# Patient Record
Sex: Female | Born: 1955
Health system: Southern US, Community
[De-identification: ages and names within clinical notes are randomized; demographics above are authoritative.]

## PROBLEM LIST (undated history)

## (undated) DIAGNOSIS — E785 Hyperlipidemia, unspecified: Secondary | ICD-10-CM

## (undated) DIAGNOSIS — K635 Polyp of colon: Secondary | ICD-10-CM

## (undated) DIAGNOSIS — Z86010 Personal history of colonic polyps: Secondary | ICD-10-CM

## (undated) DIAGNOSIS — M1990 Unspecified osteoarthritis, unspecified site: Secondary | ICD-10-CM

## (undated) DIAGNOSIS — T7840XA Allergy, unspecified, initial encounter: Secondary | ICD-10-CM

## (undated) DIAGNOSIS — E119 Type 2 diabetes mellitus without complications: Secondary | ICD-10-CM

## (undated) DIAGNOSIS — I10 Essential (primary) hypertension: Secondary | ICD-10-CM

## (undated) HISTORY — DX: Polyp of colon: K63.5

## (undated) HISTORY — DX: Essential (primary) hypertension: I10

## (undated) HISTORY — DX: Personal history of colonic polyps: Z86.010

## (undated) HISTORY — DX: Allergy, unspecified, initial encounter: T78.40XA

## (undated) HISTORY — DX: Unspecified osteoarthritis, unspecified site: M19.90

## (undated) HISTORY — DX: Type 2 diabetes mellitus without complications: E11.9

## (undated) HISTORY — DX: Hyperlipidemia, unspecified: E78.5

## (undated) HISTORY — PX: REPLACEMENT TOTAL KNEE: SUR1224

---

## 1981-03-16 HISTORY — PX: TUBAL LIGATION: SHX77

## 1996-10-14 HISTORY — PX: ABDOMINAL HYSTERECTOMY: SHX81

## 1999-03-04 ENCOUNTER — Other Ambulatory Visit: Admission: RE | Admit: 1999-03-04 | Discharge: 1999-03-04 | Payer: Self-pay | Admitting: Obstetrics & Gynecology

## 2000-08-30 ENCOUNTER — Other Ambulatory Visit: Admission: RE | Admit: 2000-08-30 | Discharge: 2000-08-30 | Payer: Self-pay | Admitting: Obstetrics & Gynecology

## 2002-03-15 ENCOUNTER — Other Ambulatory Visit: Admission: RE | Admit: 2002-03-15 | Discharge: 2002-03-15 | Payer: Self-pay | Admitting: Obstetrics & Gynecology

## 2002-03-22 ENCOUNTER — Encounter: Admission: RE | Admit: 2002-03-22 | Discharge: 2002-03-22 | Payer: Self-pay | Admitting: Obstetrics & Gynecology

## 2002-03-22 ENCOUNTER — Encounter: Payer: Self-pay | Admitting: Obstetrics & Gynecology

## 2003-04-02 ENCOUNTER — Encounter: Admission: RE | Admit: 2003-04-02 | Discharge: 2003-07-01 | Payer: Self-pay | Admitting: Internal Medicine

## 2003-06-26 ENCOUNTER — Other Ambulatory Visit: Admission: RE | Admit: 2003-06-26 | Discharge: 2003-06-26 | Payer: Self-pay | Admitting: Obstetrics and Gynecology

## 2004-03-31 ENCOUNTER — Ambulatory Visit (HOSPITAL_COMMUNITY): Admission: RE | Admit: 2004-03-31 | Discharge: 2004-03-31 | Payer: Self-pay | Admitting: Obstetrics & Gynecology

## 2005-04-07 ENCOUNTER — Ambulatory Visit (HOSPITAL_COMMUNITY): Admission: RE | Admit: 2005-04-07 | Discharge: 2005-04-07 | Payer: Self-pay | Admitting: Obstetrics & Gynecology

## 2005-04-07 ENCOUNTER — Other Ambulatory Visit: Admission: RE | Admit: 2005-04-07 | Discharge: 2005-04-07 | Payer: Self-pay | Admitting: Obstetrics & Gynecology

## 2005-10-16 DIAGNOSIS — K635 Polyp of colon: Secondary | ICD-10-CM

## 2005-10-16 HISTORY — DX: Polyp of colon: K63.5

## 2005-10-16 HISTORY — PX: COLONOSCOPY W/ BIOPSIES: SHX1374

## 2006-09-07 ENCOUNTER — Ambulatory Visit (HOSPITAL_COMMUNITY): Admission: RE | Admit: 2006-09-07 | Discharge: 2006-09-07 | Payer: Self-pay | Admitting: Obstetrics & Gynecology

## 2007-09-08 ENCOUNTER — Ambulatory Visit (HOSPITAL_COMMUNITY): Admission: RE | Admit: 2007-09-08 | Discharge: 2007-09-08 | Payer: Self-pay | Admitting: Obstetrics & Gynecology

## 2009-01-24 ENCOUNTER — Ambulatory Visit (HOSPITAL_COMMUNITY): Admission: RE | Admit: 2009-01-24 | Discharge: 2009-01-24 | Payer: Self-pay | Admitting: Family Medicine

## 2010-03-06 ENCOUNTER — Ambulatory Visit (HOSPITAL_COMMUNITY)
Admission: RE | Admit: 2010-03-06 | Discharge: 2010-03-06 | Payer: Self-pay | Source: Home / Self Care | Attending: Family Medicine | Admitting: Family Medicine

## 2010-04-06 ENCOUNTER — Encounter: Payer: Self-pay | Admitting: Emergency Medicine

## 2011-02-12 ENCOUNTER — Other Ambulatory Visit (HOSPITAL_COMMUNITY): Payer: Self-pay | Admitting: Family Medicine

## 2011-02-12 DIAGNOSIS — Z1231 Encounter for screening mammogram for malignant neoplasm of breast: Secondary | ICD-10-CM

## 2011-04-06 ENCOUNTER — Ambulatory Visit (HOSPITAL_COMMUNITY): Payer: BC Managed Care – PPO

## 2011-04-07 ENCOUNTER — Ambulatory Visit (HOSPITAL_COMMUNITY): Payer: BC Managed Care – PPO

## 2011-04-07 ENCOUNTER — Ambulatory Visit (HOSPITAL_COMMUNITY)
Admission: RE | Admit: 2011-04-07 | Discharge: 2011-04-07 | Disposition: A | Discharge status: Home / Self Care | Payer: BC Managed Care – PPO | Source: Ambulatory Visit | Attending: Family Medicine | Admitting: Family Medicine

## 2011-04-07 DIAGNOSIS — Z1231 Encounter for screening mammogram for malignant neoplasm of breast: Secondary | ICD-10-CM | POA: Insufficient documentation

## 2012-03-21 ENCOUNTER — Other Ambulatory Visit (HOSPITAL_COMMUNITY): Payer: Self-pay | Admitting: Family Medicine

## 2012-03-21 DIAGNOSIS — Z1231 Encounter for screening mammogram for malignant neoplasm of breast: Secondary | ICD-10-CM

## 2012-03-23 ENCOUNTER — Encounter: Payer: Self-pay | Admitting: Internal Medicine

## 2012-04-07 ENCOUNTER — Ambulatory Visit (HOSPITAL_COMMUNITY)
Admission: RE | Admit: 2012-04-07 | Discharge: 2012-04-07 | Disposition: A | Discharge status: Home / Self Care | Payer: BC Managed Care – PPO | Source: Ambulatory Visit | Attending: Family Medicine | Admitting: Family Medicine

## 2012-04-07 DIAGNOSIS — Z1231 Encounter for screening mammogram for malignant neoplasm of breast: Secondary | ICD-10-CM

## 2012-04-15 ENCOUNTER — Ambulatory Visit: Payer: BC Managed Care – PPO | Admitting: Internal Medicine

## 2012-05-16 ENCOUNTER — Encounter: Payer: Self-pay | Admitting: Internal Medicine

## 2012-06-08 ENCOUNTER — Ambulatory Visit (INDEPENDENT_AMBULATORY_CARE_PROVIDER_SITE_OTHER): Payer: BC Managed Care – PPO | Admitting: Internal Medicine

## 2012-06-08 ENCOUNTER — Encounter: Payer: Self-pay | Admitting: Internal Medicine

## 2012-06-08 VITALS — BP 136/88 | HR 72 | Ht 63.25 in | Wt 163.4 lb

## 2012-06-08 DIAGNOSIS — K5140 Inflammatory polyps of colon without complications: Secondary | ICD-10-CM

## 2012-06-08 NOTE — Patient Instructions (Addendum)
Dr. Leone Payor is going to double check your reports and we will contact you within the next 2 weeks regarding the year you will need your next colonoscopy.   Thank you for choosing me and [facility].  Iva Boop, M.D., [facility]

## 2012-06-08 NOTE — Progress Notes (Signed)
  Subjective:    Patient ID: Lisa Robertson, female    DOB: [birth year], 57 y.o.   MRN: [id number]  HPI This very nice lady is here about repeating a colonoscopy. She had one performed by Dr. Bosie Clos on 10/16/2005 and a 15 mm pedunculated  inflammatory polyp was removed from the descending colon. Dr. Bosie Clos recommended a repat colonoscopy in 1 year. She has not doe that. Her PCP saw this and recommended she repeat the colonoscopy so she is here to discuss that.  She has no active GI sxs at this time.  No Known Allergies Current outpatient prescriptions:aspirin 81 MG tablet, Take 81 mg by mouth daily., Disp: , Rfl: ;  atenolol (TENORMIN) 50 MG tablet, Take 50 mg by mouth daily., Disp: , Rfl: ;  Cholecalciferol (VITAMIN D-3) 1000 UNITS CAPS, Take 1 capsule by mouth daily., Disp: , Rfl: ;  glipiZIDE-metformin (METAGLIP) 5-500 MG per tablet, Take 1 tablet by mouth 2 (two) times daily before a meal., Disp: , Rfl:  hydrochlorothiazide (MICROZIDE) 12.5 MG capsule, Take 12.5 mg by mouth 2 (two) times daily., Disp: , Rfl: ;  losartan (COZAAR) 100 MG tablet, Take 100 mg by mouth daily., Disp: , Rfl: ;  meloxicam (MOBIC) 15 MG tablet, Take 15 mg by mouth daily., Disp: , Rfl: ;  simvastatin (ZOCOR) 20 MG tablet, Take 20 mg by mouth every evening., Disp: , Rfl: ;  sitaGLIPtin (JANUVIA) 50 MG tablet, Take 50 mg by mouth daily., Disp: , Rfl:   Past Medical History  Diagnosis Date  . DM (diabetes mellitus)   . Colon polyp 10/16/2005    inflamed, Dr. Charlott Rakes  . Hypertension   . Hyperlipemia    Past Surgical History  Procedure Laterality Date  . Colonoscopy w/ biopsies  10/16/2005    Dr. Charlott Rakes  . Abdominal hysterectomy  10/1996  . Tubal ligation  1983   History   Social History  . Marital Status: Married    Spouse Name: N/A    Number of Children: 3       Occupational History  . OFFICE SUPPORT     Retired   Social History Main Topics  . Smoking status: Never Smoker   . Smokeless  tobacco: Never Used  . Alcohol Use: No  . Drug Use: No    Family History  Problem Relation Age of Onset  . Colon cancer Neg Hx   . Diabetes Sister     x2  . Diabetes Mother   . Diabetes Father   . Diabetes Maternal Grandmother   . Diabetes Paternal Grandmother   . Heart disease Father   . Prostate cancer Father     Review of Systems + osteoarthritis pain    Objective:   Physical Exam WDWN NAD Eyes anicteric Pleasant and appropriate affect      Assessment & Plan:  Inflammatory polyps of colon without complications  This has been removed and is not a pre-cancerous polyp. My understanding is that she would revert to routine colon cancer screening and a colonoscopy 10 yrs later which would be 10/2015. I explained this and have also decided to have the polyp pathology reviewed by another GI pathologist just to be sure but doubt we would change the plan.  She was appreciative.  I appreciate the opportunity to care for this patient.  [id number] A, MD   .

## 2012-06-09 ENCOUNTER — Telehealth: Payer: Self-pay

## 2012-06-09 NOTE — Telephone Encounter (Signed)
Spoke to Dr. Frederica Kuster and he will be in touch after reviewing the 2007 path report.  He said it could take several days since it is archived.

## 2012-06-09 NOTE — Telephone Encounter (Signed)
Case # for colonoscopy is [phone] that Dr. Frederica Kuster is reviewing.

## 2012-06-22 ENCOUNTER — Telehealth: Payer: Self-pay | Admitting: Internal Medicine

## 2012-06-22 NOTE — Telephone Encounter (Signed)
Informed patient that I had spoken to Dr. Leone Payor last night about this and he has not heard back from Dr. [place] Rund yet but as soon as we do we will be in touch.  Dr. Frederica Kuster told me this could take some time to get the path to review because it was done in 2007.

## 2012-07-18 NOTE — Telephone Encounter (Signed)
Please check back with Dr. Frederica Kuster about this

## 2012-07-18 NOTE — Telephone Encounter (Signed)
Left message with his office for him to contact us with an update.  He had already left for the day.

## 2012-07-19 NOTE — Telephone Encounter (Signed)
Spoke to Dr. Frederica Kuster this AM.  He said to apologize that this has fallen thru the cracks.  Dr. Luisa Hart was suppose to be working on this and he is out thru [employer].  Therefore Dr. Frederica Kuster is going to personally get the case pulled and will be back in touch soon.

## 2012-08-01 ENCOUNTER — Telehealth: Payer: Self-pay | Admitting: Internal Medicine

## 2012-08-01 NOTE — Telephone Encounter (Signed)
Yes - if we haven't called we should tell her it was just inflammatory polyp so next colonoscopy 10 years from last one

## 2012-08-01 NOTE — Telephone Encounter (Signed)
Sir did Dr. [place] Rund get back to you on this path report?

## 2012-08-01 NOTE — Telephone Encounter (Signed)
Patient informed that colonoscopy not needed until 2017 per Dr. Leone Payor.  She was happy to hear that news.  Recall put in epic for Aug. 2017.

## 2013-03-22 ENCOUNTER — Other Ambulatory Visit (HOSPITAL_COMMUNITY): Payer: Self-pay | Admitting: Family Medicine

## 2013-03-22 DIAGNOSIS — Z1231 Encounter for screening mammogram for malignant neoplasm of breast: Secondary | ICD-10-CM

## 2013-04-10 ENCOUNTER — Ambulatory Visit (HOSPITAL_COMMUNITY)
Admission: RE | Admit: 2013-04-10 | Discharge: 2013-04-10 | Disposition: A | Discharge status: Home / Self Care | Payer: BC Managed Care – PPO | Source: Ambulatory Visit | Attending: Family Medicine | Admitting: Family Medicine

## 2013-04-10 ENCOUNTER — Ambulatory Visit (HOSPITAL_COMMUNITY): Payer: BC Managed Care – PPO

## 2013-04-10 DIAGNOSIS — Z1231 Encounter for screening mammogram for malignant neoplasm of breast: Secondary | ICD-10-CM | POA: Insufficient documentation

## 2014-03-20 ENCOUNTER — Other Ambulatory Visit (HOSPITAL_COMMUNITY): Payer: Self-pay | Admitting: Family Medicine

## 2014-03-20 DIAGNOSIS — Z1231 Encounter for screening mammogram for malignant neoplasm of breast: Secondary | ICD-10-CM

## 2014-04-25 ENCOUNTER — Ambulatory Visit (HOSPITAL_COMMUNITY): Payer: BC Managed Care – PPO

## 2014-05-01 ENCOUNTER — Ambulatory Visit (HOSPITAL_COMMUNITY): Payer: BC Managed Care – PPO

## 2014-05-10 ENCOUNTER — Ambulatory Visit (HOSPITAL_COMMUNITY): Payer: BC Managed Care – PPO

## 2014-05-21 ENCOUNTER — Encounter: Payer: Self-pay | Admitting: Internal Medicine

## 2014-05-21 ENCOUNTER — Telehealth: Payer: Self-pay | Admitting: *Deleted

## 2014-05-21 ENCOUNTER — Ambulatory Visit (INDEPENDENT_AMBULATORY_CARE_PROVIDER_SITE_OTHER): Payer: BC Managed Care – PPO | Admitting: Internal Medicine

## 2014-05-21 VITALS — BP 132/82 | HR 82 | Temp 97.8°F | Wt 159.0 lb

## 2014-05-21 DIAGNOSIS — H6691 Otitis media, unspecified, right ear: Secondary | ICD-10-CM | POA: Diagnosis not present

## 2014-05-21 DIAGNOSIS — H6692 Otitis media, unspecified, left ear: Secondary | ICD-10-CM

## 2014-05-21 DIAGNOSIS — E119 Type 2 diabetes mellitus without complications: Secondary | ICD-10-CM | POA: Diagnosis not present

## 2014-05-21 DIAGNOSIS — R05 Cough: Secondary | ICD-10-CM | POA: Diagnosis not present

## 2014-05-21 DIAGNOSIS — R059 Cough, unspecified: Secondary | ICD-10-CM

## 2014-05-21 DIAGNOSIS — R197 Diarrhea, unspecified: Secondary | ICD-10-CM

## 2014-05-21 LAB — POCT URINALYSIS DIPSTICK
BILIRUBIN UA: NEGATIVE
Blood, UA: NEGATIVE
GLUCOSE UA: NEGATIVE
Ketones, UA: NEGATIVE
LEUKOCYTES UA: NEGATIVE
NITRITE UA: NEGATIVE
Protein, UA: NEGATIVE
Spec Grav, UA: 1.015
UROBILINOGEN UA: NEGATIVE
pH, UA: 5

## 2014-05-21 MED ORDER — BENZONATATE 200 MG PO CAPS: 200.0000 mg | ORAL_CAPSULE | Freq: Three times a day (TID) | ORAL | Status: DC | PRN

## 2014-05-21 MED ORDER — LEVOFLOXACIN 500 MG PO TABS: 500.0000 mg | ORAL_TABLET | Freq: Every day | ORAL | Status: DC

## 2014-05-21 MED ORDER — METHYLPREDNISOLONE ACETATE 80 MG/ML IJ SUSP
80.0000 mg | Freq: Once | INTRAMUSCULAR | Status: AC
  Administered 2014-05-21: 80 mg via INTRAMUSCULAR

## 2014-05-21 MED ORDER — ONDANSETRON HCL 4 MG PO TABS: 4.0000 mg | ORAL_TABLET | Freq: Three times a day (TID) | ORAL | Status: DC | PRN

## 2014-05-21 NOTE — Patient Instructions (Signed)
Take Levaquin 500 mg daily for 10 days for ear infection. Take Zofran 4 mg tablets as needed for nausea. Take Tessalon Perles as needed for cough. Discontinue metformin. Depo-Medrol given today for ear infection. Continue to monitor Accu-Cheks. Return in 2 weeks.

## 2014-05-21 NOTE — Progress Notes (Signed)
   Subjective:    Patient ID: Lisa Robertson, female    DOB: 1955-11-09, 59 y.o.   MRN: [id number]  HPI  59 year old Black Female presents to the office for the first time since 2005. Most recently seen by [facility] physicians. Patient has a history of diabetes mellitus and is on Levemir and NovoLog. History of hypertension since 1978. Patient is coughing a fair amount in the office.  Recently treated for an ear infection with an injection and oral antibiotics. Was also given some otic drops. Cannot hear well out of left ear. Has felt nauseated. Has had some diarrhea. Sometimes coughing provokes vomiting.  No known drug allergies  History of bilateral tubal ligation 1983  Total abdominal hysterectomy without oophorectomy for fibroids by Dr. Nori Riis in [place] history: She is married. Has 3 sisters and no brothers. 2 sons and 1 daughter. Does not smoke or consume alcohol. 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 Husband is employed at [employer] in operating room.  Family history: Son with history of kidney transplant from uncontrolled hypertension father with history of hypertension prostate cancer and diabetes along with heart disease. Mother with history of diabetes and hypertension.    Review of Systems     Objective:   Physical Exam  Left TM is red thick and dull. Right TM is slightly full but not red. Pharynx is clear. Neck is supple without adenopathy. Chest is clear. Urinalysis is normal.      Assessment & Plan:  Left otitis media  URI  Diabetes mellitus  Hypertension  Plan: She is to have a chest x-ray tomorrow since she presented to the office late this afternoon. Urinalysis is normal. Given Depo-Medrol 80 mg IM. Levaquin 500 milligrams daily for 10 days. Zofran 4 mg tablets every 8 hours when necessary nausea. Stop metformin to see if diarrhea improves.  Tessalon Perles as needed for cough.

## 2014-05-22 ENCOUNTER — Ambulatory Visit
Admission: RE | Admit: 2014-05-22 | Discharge: 2014-05-22 | Disposition: A | Discharge status: Home / Self Care | Payer: BC Managed Care – PPO | Source: Ambulatory Visit | Attending: Internal Medicine | Admitting: Internal Medicine

## 2014-05-22 DIAGNOSIS — R05 Cough: Secondary | ICD-10-CM

## 2014-05-22 DIAGNOSIS — R059 Cough, unspecified: Secondary | ICD-10-CM

## 2014-05-22 LAB — CBC WITH DIFFERENTIAL/PLATELET
Basophils Absolute: 0 10*3/uL (ref 0.0–0.1)
Basophils Relative: 0 % (ref 0–1)
Eosinophils Absolute: 0.3 10*3/uL (ref 0.0–0.7)
Eosinophils Relative: 2 % (ref 0–5)
HEMATOCRIT: 39.6 % (ref 36.0–46.0)
HEMOGLOBIN: 13.3 g/dL (ref 12.0–15.0)
LYMPHS ABS: 5.1 10*3/uL — AB (ref 0.7–4.0)
LYMPHS PCT: 34 % (ref 12–46)
MCH: 29.3 pg (ref 26.0–34.0)
MCHC: 33.6 g/dL (ref 30.0–36.0)
MCV: 87.2 fL (ref 78.0–100.0)
MONOS PCT: 12 % (ref 3–12)
MPV: 9.5 fL (ref 8.6–12.4)
Monocytes Absolute: 1.8 10*3/uL — ABNORMAL HIGH (ref 0.1–1.0)
NEUTROS ABS: 7.7 10*3/uL (ref 1.7–7.7)
NEUTROS PCT: 52 % (ref 43–77)
Platelets: 348 10*3/uL (ref 150–400)
RBC: 4.54 MIL/uL (ref 3.87–5.11)
RDW: 13.9 % (ref 11.5–15.5)
WBC: 14.9 10*3/uL — AB (ref 4.0–10.5)

## 2014-05-22 LAB — COMPREHENSIVE METABOLIC PANEL
ALBUMIN: 3.8 g/dL (ref 3.5–5.2)
ALK PHOS: 61 U/L (ref 39–117)
ALT: 14 U/L (ref 0–35)
AST: 15 U/L (ref 0–37)
BUN: 21 mg/dL (ref 6–23)
CO2: 26 mEq/L (ref 19–32)
CREATININE: 0.88 mg/dL (ref 0.50–1.10)
Calcium: 10.3 mg/dL (ref 8.4–10.5)
Chloride: 97 mEq/L (ref 96–112)
Glucose, Bld: 138 mg/dL — ABNORMAL HIGH (ref 70–99)
POTASSIUM: 3.7 meq/L (ref 3.5–5.3)
Sodium: 136 mEq/L (ref 135–145)
Total Bilirubin: 0.2 mg/dL (ref 0.2–1.2)
Total Protein: 7.7 g/dL (ref 6.0–8.3)

## 2014-05-22 LAB — PATHOLOGIST SMEAR REVIEW

## 2014-05-22 NOTE — Telephone Encounter (Signed)
Reviewed chest xray results with patient.

## 2014-05-24 ENCOUNTER — Telehealth: Payer: Self-pay | Admitting: Internal Medicine

## 2014-05-24 NOTE — Telephone Encounter (Signed)
Hold Metformin until seen in 2 weeks.

## 2014-05-24 NOTE — Telephone Encounter (Signed)
When she was in on Monday, you had told her to hold the Metformin for 2 days r/t the Diabetes and her BM's are soft (no longer diarrhea).  So, she is still uncomfortable, and wants to know what is the next step.  Does she continue to hold the Metformin?  Or, what do you want her to do now?  Her blood sugars this a.m. were 156 (prior to taking her insulin).  She tool her insulin after checking sugar.  Does she start Metformin back?  Or, what are your instructions for her at this time?  Please advise.

## 2014-05-24 NOTE — Telephone Encounter (Signed)
Instructions given to patient

## 2014-05-31 ENCOUNTER — Telehealth: Payer: Self-pay | Admitting: Internal Medicine

## 2014-05-31 NOTE — Telephone Encounter (Signed)
Patient scheduled for appt 06/01/14 at 3:30 per Dr Renold Genta

## 2014-05-31 NOTE — Telephone Encounter (Signed)
Patient states she feels she needs to be seen again regarding her soft stools.  States the Immodium helped for the first couple of days.  We took her off of her Metformin.  She's still off of it.  She states that her stools are consistently soft and she needs some relief from that.  Wants to know what she should do?  She's going all the time with these soft stools.    Do you want to see her?

## 2014-05-31 NOTE — Telephone Encounter (Signed)
Is this diarrhea or just soft stools. How many a day?

## 2014-05-31 NOTE — Telephone Encounter (Signed)
Patient states she is having soft stools 7-8 per day she is taking imodium 4 times daily . Patient states she has some bowel incontinence at times when passing gas.

## 2014-05-31 NOTE — Telephone Encounter (Signed)
Appt tomoorow

## 2014-06-01 ENCOUNTER — Encounter: Payer: Self-pay | Admitting: Internal Medicine

## 2014-06-01 ENCOUNTER — Ambulatory Visit (INDEPENDENT_AMBULATORY_CARE_PROVIDER_SITE_OTHER): Payer: BC Managed Care – PPO | Admitting: Internal Medicine

## 2014-06-01 VITALS — BP 118/84 | HR 83 | Temp 99.1°F | Wt 159.0 lb

## 2014-06-01 DIAGNOSIS — R197 Diarrhea, unspecified: Secondary | ICD-10-CM | POA: Diagnosis not present

## 2014-06-01 DIAGNOSIS — Z794 Long term (current) use of insulin: Secondary | ICD-10-CM | POA: Diagnosis not present

## 2014-06-01 DIAGNOSIS — IMO0001 Reserved for inherently not codable concepts without codable children: Secondary | ICD-10-CM

## 2014-06-01 DIAGNOSIS — E119 Type 2 diabetes mellitus without complications: Secondary | ICD-10-CM | POA: Diagnosis not present

## 2014-06-01 DIAGNOSIS — H6692 Otitis media, unspecified, left ear: Secondary | ICD-10-CM | POA: Diagnosis not present

## 2014-06-01 MED ORDER — METRONIDAZOLE 500 MG PO TABS: 500.0000 mg | ORAL_TABLET | Freq: Two times a day (BID) | ORAL | Status: DC

## 2014-06-01 MED ORDER — HYOSCYAMINE SULFATE 0.125 MG SL SUBL: 0.1250 mg | SUBLINGUAL_TABLET | Freq: Three times a day (TID) | SUBLINGUAL | Status: DC

## 2014-06-01 NOTE — Patient Instructions (Addendum)
Take Flagyl 500 mg twice a day x 7 days. Try Levsin before meals. You have return appt  March 28.

## 2014-06-02 NOTE — Progress Notes (Signed)
   Subjective:    Patient ID: Lisa Robertson, female    DOB: [birth year], 59 y.o.   MRN: [id number]  HPI  Patient was complaining of diarrhea on metformin. We stopped metformin. Stools became more formed but are still soft and frequent. Sometimes she has a little bit of fecal incontinence think and she has flatus. She may have 3 bowel movements shortly after arising around 5 AM each morning.  With regard to recent ear infection, she says ear still does not feel 100% well. Has finished course of antibiotics. Antibiotics did not affect issue with stools.    Review of Systems     Objective:   Physical Exam  Left TM much clearer than last exam.      Assessment & Plan:  Type 2 diabetes mellitus-she's currently on insulin. Off metformin.  Wonder if issue with stools is related to diabetes (Enteropathy)? Could this be irritable bowel syndrome or possibly colitis? She has no travel history. Does not consume well water. I've spoken with her about possible lactose intolerance. She is to avoid lactose for now. She had a colonoscopy by Dr. Michail Sermon in 2007 with an inflammatory polyp being removed. She saw Dr. Carlean Purl for an opinion about another colonoscopy in 2014. He did not feel like another colonoscopy was indicated at that time. She was asymptomatic with regard to her stool situation then.  Plan: I'm going to treat her with Levsin before meals. I'm also going to give her Flagyl 500 mg twice daily for 7 days. If symptoms persist, we will refer her back to gastroenterology for further evaluation.

## 2014-06-11 ENCOUNTER — Other Ambulatory Visit: Payer: Self-pay | Admitting: *Deleted

## 2014-06-11 ENCOUNTER — Encounter: Payer: Self-pay | Admitting: Internal Medicine

## 2014-06-11 ENCOUNTER — Ambulatory Visit (INDEPENDENT_AMBULATORY_CARE_PROVIDER_SITE_OTHER): Payer: BC Managed Care – PPO | Admitting: Internal Medicine

## 2014-06-11 VITALS — BP 116/78 | HR 65 | Temp 97.6°F | Wt 160.0 lb

## 2014-06-11 DIAGNOSIS — H6692 Otitis media, unspecified, left ear: Secondary | ICD-10-CM

## 2014-06-11 DIAGNOSIS — Z794 Long term (current) use of insulin: Secondary | ICD-10-CM

## 2014-06-11 DIAGNOSIS — R197 Diarrhea, unspecified: Secondary | ICD-10-CM

## 2014-06-11 DIAGNOSIS — E119 Type 2 diabetes mellitus without complications: Secondary | ICD-10-CM

## 2014-06-11 DIAGNOSIS — IMO0001 Reserved for inherently not codable concepts without codable children: Secondary | ICD-10-CM

## 2014-06-11 MED ORDER — INSULIN PEN NEEDLE 32G X 4 MM MISC: Status: DC

## 2014-06-11 MED ORDER — INSULIN ASPART 100 UNIT/ML FLEXPEN: 10.0000 [IU] | PEN_INJECTOR | Freq: Three times a day (TID) | SUBCUTANEOUS | Status: DC

## 2014-06-11 MED ORDER — DILTIAZEM HCL ER COATED BEADS 300 MG PO CP24: 300.0000 mg | ORAL_CAPSULE | Freq: Every day | ORAL | Status: DC

## 2014-06-11 MED ORDER — HYDROCHLOROTHIAZIDE 12.5 MG PO CAPS: 12.5000 mg | ORAL_CAPSULE | Freq: Two times a day (BID) | ORAL | Status: DC

## 2014-06-11 MED ORDER — ATENOLOL 50 MG PO TABS: 50.0000 mg | ORAL_TABLET | Freq: Every day | ORAL | Status: DC

## 2014-06-11 MED ORDER — ONETOUCH ULTRA BLUE VI STRP: ORAL_STRIP | Status: DC

## 2014-06-11 MED ORDER — SIMVASTATIN 20 MG PO TABS: 20.0000 mg | ORAL_TABLET | Freq: Every evening | ORAL | Status: DC

## 2014-06-11 NOTE — Addendum Note (Signed)
Addended by: Elby Showers on: 06/11/2014 11:39 AM   Modules accepted: Level of Service

## 2014-06-11 NOTE — Telephone Encounter (Signed)
Refills sent to patient pharmacy per Dr Renold Genta

## 2014-06-11 NOTE — Progress Notes (Addendum)
   Subjective:    Patient ID: Lisa Robertson, female    DOB: 1956/01/02, 59 y.o.   MRN: [id number]  HPI  At last visit I gave her Cipro and Flagyl and started her on Levsin sublingually for diarrhea. Bowel movements have slowed down quite a bit. With regard to left otitis media, hearing has improved considerably after recent course of antibiotics. She remains on insulin. Has stopped metformin.  Has seen gastroenterologist at [facility]  in the past. She may have some type of diabetic gastropathy and may need to see gastroenterologist in the near future. Has been told to avoid lactose products. She has been taking Imodium recently but needs to discontinue that and just take Levsin.  Wants me to check her left ear again.    Review of Systems     Objective:   Physical Exam  Left TM is chronically scarred but not red.      Assessment & Plan:  Insulin-dependent diabetes-return in 3 months for office visit and hemoglobin A1c, fasting lipid panel, and liver functions.  Hyperlipidemia-she is on statin medication and will follow-up with this in 3 months  Hypertension-stable  Probable diabetic gastropathy status post Cipro and Flagyl for possible bacterial overgrowth syndrome and now on Levsin. Discontinue Imodium  Left otitis media-resolved

## 2014-06-11 NOTE — Patient Instructions (Addendum)
Continue Levsin. Stop Imodium. If diarrhea symptoms persist, we will refer you to [facility]. Otherwise return in 3 months for office visit and hemoglobin A1c, fasting lipid panel and liver functions.

## 2014-06-12 ENCOUNTER — Other Ambulatory Visit: Payer: Self-pay | Admitting: *Deleted

## 2014-06-12 MED ORDER — LOSARTAN POTASSIUM 100 MG PO TABS: 100.0000 mg | ORAL_TABLET | Freq: Every day | ORAL | Status: DC

## 2014-06-12 NOTE — Telephone Encounter (Signed)
Refill on losartan sent to patient pharmacy

## 2014-06-13 ENCOUNTER — Ambulatory Visit (HOSPITAL_COMMUNITY): Payer: BC Managed Care – PPO

## 2014-06-14 ENCOUNTER — Ambulatory Visit (HOSPITAL_COMMUNITY)
Admission: RE | Admit: 2014-06-14 | Discharge: 2014-06-14 | Disposition: A | Discharge status: Home / Self Care | Payer: BC Managed Care – PPO | Source: Ambulatory Visit | Attending: Family Medicine | Admitting: Family Medicine

## 2014-06-14 ENCOUNTER — Other Ambulatory Visit: Payer: Self-pay | Admitting: Internal Medicine

## 2014-06-14 DIAGNOSIS — Z1231 Encounter for screening mammogram for malignant neoplasm of breast: Secondary | ICD-10-CM

## 2014-06-25 ENCOUNTER — Other Ambulatory Visit: Payer: Self-pay | Admitting: *Deleted

## 2014-06-25 MED ORDER — INSULIN DETEMIR 100 UNIT/ML FLEXPEN: PEN_INJECTOR | SUBCUTANEOUS | Status: DC

## 2014-06-25 NOTE — Telephone Encounter (Signed)
Refill on levemir sent to pharmacy

## 2014-07-09 ENCOUNTER — Ambulatory Visit (INDEPENDENT_AMBULATORY_CARE_PROVIDER_SITE_OTHER): Payer: BC Managed Care – PPO | Admitting: Internal Medicine

## 2014-07-09 ENCOUNTER — Encounter: Payer: Self-pay | Admitting: Internal Medicine

## 2014-07-09 VITALS — BP 120/74 | HR 78 | Temp 97.7°F | Wt 161.0 lb

## 2014-07-09 DIAGNOSIS — E119 Type 2 diabetes mellitus without complications: Secondary | ICD-10-CM

## 2014-07-09 DIAGNOSIS — Z794 Long term (current) use of insulin: Secondary | ICD-10-CM

## 2014-07-09 DIAGNOSIS — IMO0001 Reserved for inherently not codable concepts without codable children: Secondary | ICD-10-CM

## 2014-07-09 NOTE — Patient Instructions (Addendum)
We will investigate options for Price breaks with insulin.

## 2014-07-09 NOTE — Progress Notes (Signed)
   Subjective:    Patient ID: Lisa Robertson, female    DOB: March 05, 1956, 60 y.o.   MRN: [id number]  HPI  Patient is on Levemir long-acting insulin at night. Cost has gone up considerably. Price increased from $80-$125 last month. She was able to find a coupon helped a bit. Husband is retiring in June and she is concerned about cost. She is on [employer]. Also NovoLog as expensive as well. It is running $80 a month.  With regard to diarrhea, she's having for solid bowel movements daily. She feels well and looks great. Was pleased to get rid of diarrhea.    Review of Systems     Objective:   Physical Exam  She is asking if we can change to some other type of insulin or find some sort of cost break for her. We will have to investigate this.      Assessment & Plan:  Insulin-dependent diabetes  Cost of prescription medication  Plan: We will call: Pharmacy to see what options we have. I would think one option would be Lantus.  [facility] says patient was given a three-month supply at $40 a month that is why caused $120 for Levemir. She got 50 day supply of NovoLog at a $40 per month co-pay therefore costing $80. Pharmacist suggested she look into mail order. I suggested she also check with [facility].  We may have found her coupon on line for Levemir.

## 2014-07-17 ENCOUNTER — Telehealth: Payer: Self-pay | Admitting: *Deleted

## 2014-07-17 MED ORDER — INSULIN DETEMIR 100 UNIT/ML FLEXPEN: PEN_INJECTOR | SUBCUTANEOUS | Status: DC

## 2014-07-17 NOTE — Telephone Encounter (Signed)
Levemir is 50 units once a day

## 2014-09-10 ENCOUNTER — Ambulatory Visit (INDEPENDENT_AMBULATORY_CARE_PROVIDER_SITE_OTHER): Payer: BC Managed Care – PPO | Admitting: Internal Medicine

## 2014-09-10 ENCOUNTER — Encounter: Payer: Self-pay | Admitting: Internal Medicine

## 2014-09-10 VITALS — BP 122/82 | HR 78 | Temp 98.2°F | Wt 171.0 lb

## 2014-09-10 DIAGNOSIS — J069 Acute upper respiratory infection, unspecified: Secondary | ICD-10-CM | POA: Diagnosis not present

## 2014-09-10 DIAGNOSIS — H65112 Acute and subacute allergic otitis media (mucoid) (sanguinous) (serous), left ear: Secondary | ICD-10-CM

## 2014-09-10 DIAGNOSIS — E119 Type 2 diabetes mellitus without complications: Secondary | ICD-10-CM

## 2014-09-10 MED ORDER — AMOXICILLIN-POT CLAVULANATE 875-125 MG PO TABS: 1.0000 | ORAL_TABLET | Freq: Two times a day (BID) | ORAL | Status: DC

## 2014-09-10 MED ORDER — METHYLPREDNISOLONE ACETATE 80 MG/ML IJ SUSP
80.0000 mg | Freq: Once | INTRAMUSCULAR | Status: AC
  Administered 2014-09-10: 80 mg via INTRAMUSCULAR

## 2014-09-10 MED ORDER — METFORMIN HCL 500 MG PO TABS: 500.0000 mg | ORAL_TABLET | Freq: Two times a day (BID) | ORAL | Status: DC

## 2014-09-10 MED ORDER — BENZONATATE 200 MG PO CAPS: 200.0000 mg | ORAL_CAPSULE | Freq: Three times a day (TID) | ORAL | Status: DC | PRN

## 2014-09-10 NOTE — Patient Instructions (Signed)
Take Augmentin 875 mg twice daily for 10 days. Tessalon Perles as needed for cough. Depo-Medrol 80 mg IM given today for nasal congestion. Restart metformin once daily if glucose remains elevated.

## 2014-09-10 NOTE — Progress Notes (Signed)
   Subjective:    Patient ID: Lisa Robertson, female    DOB: 03-Oct-1955, 59 y.o.   MRN: [id number]  HPI  59 year old 15 female with type 2 diabetes in today with acute URI symptoms. Has been going on for several days. Other members at church have had similar symptoms. Patient thought initially it might be allergic rhinitis. No sore throat or ear pain. No fever or shaking chills. A lot of nasal congestion. No discolored nasal drainage.  With regard to diabetes, patient says Accu-Cheks have been creeping up recently. She  is wondering if she needs to restart metformin. I think it would be wise to start it back once daily and see how Accu-Cheks run.  Review of Systems see above     Objective:   Physical Exam  Skin warm and dry. Nodes none. Left TM is dull and red. Right TM is clear. Pharynx is clear. Neck is supple without adenopathy. Chest clear to auscultation. Nasal congestion present      Assessment & Plan:  Acute URI  Acute left otitis media  Controlled type 2 diabetes mellitus  Plan: Augmentin 875 mg twice daily for 10 days. Tessalon Perles 200 mg 3 times daily as needed for cough. Depo-Medrol 80 mg IM. Restart metformin 500 mg once daily and monitor Accu-Cheks.

## 2014-10-24 ENCOUNTER — Other Ambulatory Visit: Payer: Self-pay | Admitting: Internal Medicine

## 2014-10-24 NOTE — Telephone Encounter (Signed)
Refill for 6 months. 

## 2014-12-06 ENCOUNTER — Other Ambulatory Visit: Payer: BC Managed Care – PPO | Admitting: Internal Medicine

## 2014-12-06 DIAGNOSIS — Z79899 Other long term (current) drug therapy: Secondary | ICD-10-CM

## 2014-12-06 DIAGNOSIS — E785 Hyperlipidemia, unspecified: Secondary | ICD-10-CM

## 2014-12-06 DIAGNOSIS — Z794 Long term (current) use of insulin: Secondary | ICD-10-CM

## 2014-12-06 DIAGNOSIS — E119 Type 2 diabetes mellitus without complications: Secondary | ICD-10-CM

## 2014-12-06 DIAGNOSIS — IMO0001 Reserved for inherently not codable concepts without codable children: Secondary | ICD-10-CM

## 2014-12-06 LAB — HEMOGLOBIN A1C
Hgb A1c MFr Bld: 8.1 % — ABNORMAL HIGH (ref <5.7)
MEAN PLASMA GLUCOSE: 186 mg/dL — AB (ref <117)

## 2014-12-06 LAB — HEPATIC FUNCTION PANEL
ALT: 33 U/L — ABNORMAL HIGH (ref 6–29)
AST: 34 U/L (ref 10–35)
Albumin: 4.1 g/dL (ref 3.6–5.1)
Alkaline Phosphatase: 54 U/L (ref 33–130)
BILIRUBIN DIRECT: 0.1 mg/dL (ref <=0.2)
Indirect Bilirubin: 0.3 mg/dL (ref 0.2–1.2)
TOTAL PROTEIN: 7.2 g/dL (ref 6.1–8.1)
Total Bilirubin: 0.4 mg/dL (ref 0.2–1.2)

## 2014-12-06 LAB — BASIC METABOLIC PANEL
BUN: 17 mg/dL (ref 7–25)
CALCIUM: 9.5 mg/dL (ref 8.6–10.4)
CO2: 26 mmol/L (ref 20–31)
Chloride: 102 mmol/L (ref 98–110)
Creat: 0.7 mg/dL (ref 0.50–0.99)
Glucose, Bld: 199 mg/dL — ABNORMAL HIGH (ref 65–99)
Potassium: 3.9 mmol/L (ref 3.5–5.3)
SODIUM: 140 mmol/L (ref 135–146)

## 2014-12-06 LAB — LIPID PANEL
Cholesterol: 163 mg/dL (ref 125–200)
HDL: 44 mg/dL — ABNORMAL LOW (ref >=46)
LDL CALC: 89 mg/dL (ref <130)
Total CHOL/HDL Ratio: 3.7 Ratio (ref <=5.0)
Triglycerides: 149 mg/dL (ref <150)
VLDL: 30 mg/dL (ref <30)

## 2014-12-11 ENCOUNTER — Encounter: Payer: Self-pay | Admitting: Internal Medicine

## 2014-12-11 ENCOUNTER — Ambulatory Visit (INDEPENDENT_AMBULATORY_CARE_PROVIDER_SITE_OTHER): Payer: BC Managed Care – PPO | Admitting: Internal Medicine

## 2014-12-11 ENCOUNTER — Other Ambulatory Visit: Payer: Self-pay | Admitting: Internal Medicine

## 2014-12-11 VITALS — BP 126/78 | HR 73 | Temp 97.9°F | Ht 63.0 in | Wt 180.0 lb

## 2014-12-11 DIAGNOSIS — Z794 Long term (current) use of insulin: Secondary | ICD-10-CM | POA: Diagnosis not present

## 2014-12-11 DIAGNOSIS — E669 Obesity, unspecified: Secondary | ICD-10-CM | POA: Diagnosis not present

## 2014-12-11 DIAGNOSIS — E8881 Metabolic syndrome: Secondary | ICD-10-CM | POA: Diagnosis not present

## 2014-12-11 DIAGNOSIS — E785 Hyperlipidemia, unspecified: Secondary | ICD-10-CM

## 2014-12-11 DIAGNOSIS — E119 Type 2 diabetes mellitus without complications: Secondary | ICD-10-CM

## 2014-12-11 DIAGNOSIS — IMO0001 Reserved for inherently not codable concepts without codable children: Secondary | ICD-10-CM

## 2014-12-11 DIAGNOSIS — I10 Essential (primary) hypertension: Secondary | ICD-10-CM | POA: Diagnosis not present

## 2014-12-11 MED ORDER — ONETOUCH ULTRA BLUE VI STRP: ORAL_STRIP | Status: DC

## 2014-12-11 NOTE — Patient Instructions (Signed)
Start taking Levemir at bedtime. Increase NovoLog to 12 units before meals. Check Accu-Cheks 3-4  times daily and return in 2 weeks. Cholesterol is normal. Referred to dietitian for counseling.

## 2014-12-12 DIAGNOSIS — Z794 Long term (current) use of insulin: Principal | ICD-10-CM

## 2014-12-12 DIAGNOSIS — I10 Essential (primary) hypertension: Secondary | ICD-10-CM | POA: Insufficient documentation

## 2014-12-12 DIAGNOSIS — E8881 Metabolic syndrome: Secondary | ICD-10-CM | POA: Insufficient documentation

## 2014-12-12 DIAGNOSIS — E669 Obesity, unspecified: Secondary | ICD-10-CM | POA: Insufficient documentation

## 2014-12-12 DIAGNOSIS — IMO0001 Reserved for inherently not codable concepts without codable children: Secondary | ICD-10-CM | POA: Insufficient documentation

## 2014-12-12 DIAGNOSIS — E119 Type 2 diabetes mellitus without complications: Principal | ICD-10-CM

## 2014-12-12 NOTE — Progress Notes (Signed)
   Subjective:    Patient ID: Lisa Robertson, female    DOB: December 17, 1955, 59 y.o.   MRN: [id number]  HPI Here today to follow-up on insulin-dependent diabetes mellitus, hyperlipidemia as well as essential hypertension. She would like some dietary counseling and [facility]. She is on Levemir 50 units every morning.  . Accu-Cheks have been running high. Hemoglobin A1c is 8.1% Does take NovoLog before meals 3 times daily. Currently taking 10 units of NovoLog before meals. Sometimes Accu-Cheks run a bit low and she needs to keep some glucose tablets with her. Probably needs to eat dinner around 6 PM and have 2 peanut butter crackers at 10 PM. Lipid panel is within normal limits. SGPT slightly high at 33 which is not really concerning to me.    Review of Systems     Objective:   Physical Exam Neck is supple without JVD thyromegaly or carotid bruits. Chest clear. Cardiac exam regular rate and rhythm. Extremities without edema.       Assessment & Plan:  Essential hypertension-stable on current regimen  Insulin-dependent diabetes. She will start taking Levemir at bedtime. She'll eat 2 peanut butter crackers at 10 PM. We'll increase NovoLog to 12 units before meals and return in 2 weeks with Accu-Chek readings. Will be referred to [facility] for counseling.

## 2014-12-27 ENCOUNTER — Encounter: Payer: Self-pay | Admitting: Internal Medicine

## 2014-12-27 ENCOUNTER — Ambulatory Visit (INDEPENDENT_AMBULATORY_CARE_PROVIDER_SITE_OTHER): Payer: BC Managed Care – PPO | Admitting: Internal Medicine

## 2014-12-27 VITALS — BP 136/86 | HR 87 | Temp 98.2°F | Ht 63.0 in | Wt 180.0 lb

## 2014-12-27 DIAGNOSIS — E119 Type 2 diabetes mellitus without complications: Secondary | ICD-10-CM | POA: Diagnosis not present

## 2014-12-27 DIAGNOSIS — J069 Acute upper respiratory infection, unspecified: Secondary | ICD-10-CM

## 2014-12-27 DIAGNOSIS — Z794 Long term (current) use of insulin: Secondary | ICD-10-CM

## 2014-12-27 MED ORDER — INSULIN ASPART 100 UNIT/ML FLEXPEN: 12.0000 [IU] | PEN_INJECTOR | Freq: Three times a day (TID) | SUBCUTANEOUS | Status: DC

## 2014-12-27 NOTE — Patient Instructions (Signed)
Continue same dose of insulin and RTC Early December.

## 2015-01-12 NOTE — Progress Notes (Signed)
   Subjective:    Patient ID: Lisa Robertson, female    DOB: [birth year], 59 y.o.   MRN: [id number]  HPI Follow-up on insulin-dependent diabetes mellitus. Brings in multiple Accu-Chek readings which have improved. At last visit we increase NovoLog to 12 units before meals and had her take Levemir at bedtime instead of every morning. She is referred to [facility] for [facility] but I do not think she has had an appointment yet.    Review of Systems     Objective:   Physical Exam  Neck supple without thyromegaly JVD or carotid bruits. Chest clear. Cardiac exam regular rate and rhythm. Extremities without edema      Assessment & Plan:  Insulin-dependent diabetes-Accu-Cheks improving  Plan: In September hemoglobin A1c was 8.1%. Follow-up again in late December with office visit hemoglobin A1c. To soon  to check A1c at this point. I am pleased with her progress.

## 2015-01-16 ENCOUNTER — Encounter: Payer: BC Managed Care – PPO | Attending: Internal Medicine | Admitting: *Deleted

## 2015-01-16 ENCOUNTER — Encounter: Payer: Self-pay | Admitting: *Deleted

## 2015-01-16 VITALS — Ht 64.0 in | Wt 175.6 lb

## 2015-01-16 DIAGNOSIS — Z713 Dietary counseling and surveillance: Secondary | ICD-10-CM | POA: Insufficient documentation

## 2015-01-16 DIAGNOSIS — IMO0001 Reserved for inherently not codable concepts without codable children: Secondary | ICD-10-CM

## 2015-01-16 DIAGNOSIS — Z794 Long term (current) use of insulin: Secondary | ICD-10-CM | POA: Insufficient documentation

## 2015-01-16 DIAGNOSIS — E119 Type 2 diabetes mellitus without complications: Secondary | ICD-10-CM | POA: Diagnosis not present

## 2015-01-16 NOTE — Progress Notes (Signed)
Diabetes Self-Management Education  Visit Type: First/Initial  Appt. Start Time: 0800 Appt. End Time: 0930  01/16/2015  [name], identified by name and date of birth, is a 59 y.o. female with a diagnosis of Diabetes: Type 2.   ASSESSMENT  Height 5\' 4"  (1.626 m), weight 175 lb 9.6 oz (79.652 kg). Body mass index is 30.13 kg/(m^2).      Diabetes Self-Management Education - 01/16/15 0932    Visit Information   Visit Type First/Initial   Initial Visit   Diabetes Type Type 2   Are you currently following a meal plan? No   Are you taking your medications as prescribed? Yes   Date Diagnosed 2010   Health Coping   How would you rate your overall health? Good   Psychosocial Assessment   Patient Belief/Attitude about Diabetes Motivated to manage diabetes   Self-care barriers None   Self-management support Doctor's office;Friends;Family;Church   Other persons present Patient   Patient Concerns Glycemic Control   Special Needs None   Preferred Learning Style Auditory;Visual;Hands on   How often do you need to have someone help you when you read instructions, pamphlets, or other written materials from your doctor or pharmacy? 1 - Never   What is the last grade level you completed in school? BS college   Complications   Last HgB A1C per patient/outside source 8.1 %   How often do you check your blood sugar? 3-4 times/day   Fasting Blood glucose range (mg/dL) 130-179   Number of hypoglycemic episodes per month 0   Have you had a dilated eye exam in the past 12 months? Yes   Have you had a dental exam in the past 12 months? Yes   Are you checking your feet? Yes   How many days per week are you checking your feet? 7   Dietary Intake   Breakfast unsweetiened cereal with 2% milk OR eggs, occasionally with grits, sausage or [place] bacon   Snack (morning) fresh fruit or nuts   Lunch left overs    Snack (afternoon) cheese crackers if hungry   Dinner meat, starch, vegetables,  occasionally with salad   Snack (evening) only if worried about a low BG during the night - 1/2 PNB sandwich   Beverage(s) 8 oz OJ, diet soda OR 1/2 and 1/2 lemonade or tea, infrequently regular soda, water    Exercise   Exercise Type ADL's  has problems with her left knee   Patient Education   Previous Diabetes Education Yes (please comment)  2010   Disease state  Definition of diabetes, type 1 and 2, and the diagnosis of diabetes   Nutrition management  Role of diet in the treatment of diabetes and the relationship between the three main macronutrients and blood glucose level;Food label reading, portion sizes and measuring food.;Carbohydrate counting;Reviewed blood glucose goals for pre and post meals and how to evaluate the patients' food intake on their blood glucose level.   Physical activity and exercise  Role of exercise on diabetes management, blood pressure control and cardiac health.   Medications Reviewed patients medication for diabetes, action, purpose, timing of dose and side effects.   Monitoring Identified appropriate SMBG and/or A1C goals.   Acute complications Taught treatment of hypoglycemia - the 15 rule.   Chronic complications Relationship between chronic complications and blood glucose control   Individualized Goals (developed by patient)   Nutrition Follow meal plan discussed   Medications take my medication as prescribed  Monitoring  test blood glucose pre and post meals as discussed   Outcomes   Expected Outcomes Demonstrated interest in learning. Expect positive outcomes   Future DMSE 2 months   Program Status Not Completed      Individualized Plan for Diabetes Self-Management Training:   Learning Objective:  Patient will have a greater understanding of diabetes self-management. Patient education plan is to attend individual and/or group sessions per assessed needs and concerns.   Plan:   Patient Instructions  Plan:  Aim for 2-3 Carb Choices per meal  (30-45 grams)   Aim for 0-1 Carbs per snack if hungry or need to prevent a low BG Include protein in moderation with your meals and snacks Consider reading food labels for Total Carbohydrate of foods Consider checking BG at alternate times per day (after breakfast occasionally especially when drinking OJ)  Continue taking diabetes medication as directed by MD Consider taking Metformin at the end of your meals instead of before.   Expected Outcomes:  Demonstrated interest in learning. Expect positive outcomes  Education material provided: Living Well with Diabetes, A1C conversion sheet, Meal plan card and Carbohydrate counting sheet, Insulin Action handout  If problems or questions, patient to contact team via:  Phone and Email  Future DSME appointment: 2 months

## 2015-01-16 NOTE — Patient Instructions (Addendum)
Plan:  Aim for 2-3 Carb Choices per meal (30-45 grams)   Aim for 0-1 Carbs per snack if hungry or need to prevent a low BG Include protein in moderation with your meals and snacks Consider reading food labels for Total Carbohydrate of foods Consider checking BG at alternate times per day (after breakfast occasionally especially when drinking OJ)  Continue taking diabetes medication as directed by MD Consider taking Metformin at the end of your meals instead of before.

## 2015-02-21 ENCOUNTER — Other Ambulatory Visit: Payer: BC Managed Care – PPO | Admitting: Internal Medicine

## 2015-02-21 DIAGNOSIS — Z794 Long term (current) use of insulin: Secondary | ICD-10-CM

## 2015-02-21 LAB — HEMOGLOBIN A1C
Hgb A1c MFr Bld: 7.7 % — ABNORMAL HIGH (ref <5.7)
MEAN PLASMA GLUCOSE: 174 mg/dL — AB (ref <117)

## 2015-02-22 ENCOUNTER — Encounter: Payer: Self-pay | Admitting: Internal Medicine

## 2015-02-22 ENCOUNTER — Ambulatory Visit (INDEPENDENT_AMBULATORY_CARE_PROVIDER_SITE_OTHER): Payer: BC Managed Care – PPO | Admitting: Internal Medicine

## 2015-02-22 VITALS — BP 132/86 | HR 73 | Temp 97.9°F | Resp 20 | Ht 64.0 in | Wt 173.0 lb

## 2015-02-22 DIAGNOSIS — I10 Essential (primary) hypertension: Secondary | ICD-10-CM

## 2015-02-22 DIAGNOSIS — E8881 Metabolic syndrome: Secondary | ICD-10-CM

## 2015-02-22 DIAGNOSIS — E785 Hyperlipidemia, unspecified: Secondary | ICD-10-CM | POA: Diagnosis not present

## 2015-02-22 DIAGNOSIS — E119 Type 2 diabetes mellitus without complications: Secondary | ICD-10-CM | POA: Diagnosis not present

## 2015-02-22 DIAGNOSIS — IMO0001 Reserved for inherently not codable concepts without codable children: Secondary | ICD-10-CM

## 2015-02-22 DIAGNOSIS — Z794 Long term (current) use of insulin: Secondary | ICD-10-CM

## 2015-04-01 MED FILL — LEVEMIR FLEXTOUCH 100 UNITS: 100 | 30 days supply | Qty: 15 | Fill #5

## 2015-04-01 MED FILL — ONE TOUCH ULTRA TEST STRIPS: 50 days supply | Qty: 200 | Fill #2

## 2015-04-22 ENCOUNTER — Ambulatory Visit (INDEPENDENT_AMBULATORY_CARE_PROVIDER_SITE_OTHER): Payer: BC Managed Care – PPO | Admitting: Internal Medicine

## 2015-04-22 ENCOUNTER — Encounter: Payer: Self-pay | Admitting: Internal Medicine

## 2015-04-22 VITALS — BP 120/70 | HR 93 | Ht 64.0 in | Wt 170.0 lb

## 2015-04-22 DIAGNOSIS — J069 Acute upper respiratory infection, unspecified: Secondary | ICD-10-CM | POA: Diagnosis not present

## 2015-04-22 MED ORDER — AMOXICILLIN-POT CLAVULANATE 875-125 MG PO TABS: 1.0000 | ORAL_TABLET | Freq: Two times a day (BID) | ORAL | Status: DC

## 2015-04-22 MED ORDER — BENZONATATE 200 MG PO CAPS: 200.0000 mg | ORAL_CAPSULE | Freq: Three times a day (TID) | ORAL | Status: DC | PRN

## 2015-04-22 MED FILL — BENZONATATE 200 MG CAPSULE: 200 | 20 days supply | Qty: 60 | Fill #0

## 2015-04-22 MED FILL — AMOX TR-K CLV 875-125 MG TA: 875-125 | 10 days supply | Qty: 20 | Fill #0

## 2015-04-22 NOTE — Patient Instructions (Signed)
Take Augmentin 875 mg twice daily for 10 days. Tessalon Perles 200 mg 3 times daily as needed for cough. Rest and drink plenty of fluids.

## 2015-04-22 NOTE — Progress Notes (Signed)
   Subjective:    Patient ID: Lisa Robertson, female    DOB: [birth year], 60 y.o.   MRN: [id number]  HPI  Onset on Wednesday, February 1 of acute URI symptoms. Was out of town the previous weekend. Came down with respiratory infection symptoms subsequently. Has cough and congestion. No fever or shaking chills. No discolored sputum. No discolored nasal drainage.    Review of Systems     Objective:   Physical Exam  Skin warm and dry. Nodes none. Pharynx is clear. TMs are clear bilaterally. Neck is supple . Chest clear to auscultation without rales or wheezing.      Assessment & Plan:  Acute URI  Plan: Augmentin 875 mg twice daily for 10 days. Tessalon Perles 200 mg 3 times daily as needed for cough. Rest and drink plenty of fluids.

## 2015-05-01 MED FILL — NOVOLOG FLEXPEN SYRINGE: 100 | 50 days supply | Qty: 15 | Fill #6

## 2015-05-02 ENCOUNTER — Other Ambulatory Visit: Payer: Self-pay

## 2015-05-02 DIAGNOSIS — Z1231 Encounter for screening mammogram for malignant neoplasm of breast: Secondary | ICD-10-CM

## 2015-05-02 MED ORDER — INSULIN DETEMIR 100 UNIT/ML FLEXPEN: PEN_INJECTOR | SUBCUTANEOUS | Status: DC

## 2015-05-02 MED FILL — LEVEMIR FLEXTOUCH 100 UNITS: 100 | 30 days supply | Qty: 15 | Fill #0

## 2015-05-02 NOTE — Telephone Encounter (Signed)
Refill x one year °

## 2015-05-02 NOTE — Telephone Encounter (Signed)
Refill request received via fax 05/02/15

## 2015-05-12 NOTE — Progress Notes (Signed)
   Subjective:    Patient ID: Lisa Robertson, female    DOB: 1955-04-08, 60 y.o.   MRN: [id number]  HPI She is here today to follow-up on diabetic management. She feels well and has no complaints. She also has essential hypertension and hyperlipidemia. She is overweight. Has metabolic syndrome. She's been to the [place] and found that very helpful. Currently on Levemir 50 units at bedtime. In September hemoglobin A1c was 8.1%. Was also taking NovoLog before meals 3 times daily. Blood pressure was stable in September at 126/78.  At September visit, Levemir was changed to at bedtime rather than every morning. NovoLog was increased to 12 units before meals.    Review of Systems no new complaints     Objective:   Physical Exam  Heme globin A1c is 7.7%. Chest clear. Cardiac exam regular rate and rhythm. Extremities without edema.      Assessment & Plan:  Improved diabetic control with hemoglobin A1c 7.7%.  Plan: Follow-up in March at time of physical examination. Continue same regimen.

## 2015-05-12 NOTE — Patient Instructions (Signed)
I am very pleased with your improvement in diabetic control at 7.7%. Continue same regimen return in March for physical exam.

## 2015-05-23 MED FILL — metFORMIN HCL 500 MG TABS: 500 | 30 days supply | Qty: 60 | Fill #5

## 2015-05-23 MED FILL — ONE TOUCH ULTRA TEST STRIPS: 50 days supply | Qty: 200 | Fill #3

## 2015-05-28 ENCOUNTER — Other Ambulatory Visit: Payer: BC Managed Care – PPO | Admitting: Internal Medicine

## 2015-05-28 DIAGNOSIS — I10 Essential (primary) hypertension: Secondary | ICD-10-CM

## 2015-05-28 DIAGNOSIS — Z0000 Encounter for general adult medical examination without abnormal findings: Secondary | ICD-10-CM

## 2015-05-28 DIAGNOSIS — E785 Hyperlipidemia, unspecified: Secondary | ICD-10-CM

## 2015-05-28 DIAGNOSIS — E119 Type 2 diabetes mellitus without complications: Secondary | ICD-10-CM

## 2015-05-28 DIAGNOSIS — E8881 Metabolic syndrome: Secondary | ICD-10-CM

## 2015-05-28 LAB — COMPREHENSIVE METABOLIC PANEL
ALT: 39 U/L — ABNORMAL HIGH (ref 6–29)
AST: 40 U/L — AB (ref 10–35)
Albumin: 4.2 g/dL (ref 3.6–5.1)
Alkaline Phosphatase: 54 U/L (ref 33–130)
BUN: 25 mg/dL (ref 7–25)
CALCIUM: 10 mg/dL (ref 8.6–10.4)
CO2: 28 mmol/L (ref 20–31)
Chloride: 101 mmol/L (ref 98–110)
Creat: 0.8 mg/dL (ref 0.50–0.99)
GLUCOSE: 130 mg/dL — AB (ref 65–99)
POTASSIUM: 3.5 mmol/L (ref 3.5–5.3)
Sodium: 141 mmol/L (ref 135–146)
Total Bilirubin: 0.5 mg/dL (ref 0.2–1.2)
Total Protein: 7.7 g/dL (ref 6.1–8.1)

## 2015-05-28 LAB — LIPID PANEL
CHOL/HDL RATIO: 4.2 ratio (ref <=5.0)
Cholesterol: 140 mg/dL (ref 125–200)
HDL: 33 mg/dL — AB (ref >=46)
LDL CALC: 81 mg/dL (ref <130)
Triglycerides: 130 mg/dL (ref <150)
VLDL: 26 mg/dL (ref <30)

## 2015-05-28 LAB — TSH: TSH: 0.84 m[IU]/L

## 2015-05-28 NOTE — Addendum Note (Signed)
Addended by: Milta Deiters on: 05/28/2015 10:35 AM   Modules accepted: Orders

## 2015-05-29 LAB — CBC WITH DIFFERENTIAL/PLATELET
BASOS ABS: 0 10*3/uL (ref 0.0–0.1)
Basophils Relative: 0 % (ref 0–1)
EOS ABS: 0.2 10*3/uL (ref 0.0–0.7)
Eosinophils Relative: 3 % (ref 0–5)
HEMATOCRIT: 42.1 % (ref 36.0–46.0)
Hemoglobin: 13.9 g/dL (ref 12.0–15.0)
LYMPHS ABS: 3.4 10*3/uL (ref 0.7–4.0)
LYMPHS PCT: 57 % — AB (ref 12–46)
MCH: 29.8 pg (ref 26.0–34.0)
MCHC: 33 g/dL (ref 30.0–36.0)
MCV: 90.1 fL (ref 78.0–100.0)
MPV: 9.5 fL (ref 8.6–12.4)
Monocytes Absolute: 0.4 10*3/uL (ref 0.1–1.0)
Monocytes Relative: 6 % (ref 3–12)
NEUTROS PCT: 34 % — AB (ref 43–77)
Neutro Abs: 2 10*3/uL (ref 1.7–7.7)
PLATELETS: 251 10*3/uL (ref 150–400)
RBC: 4.67 MIL/uL (ref 3.87–5.11)
RDW: 14.2 % (ref 11.5–15.5)
WBC: 5.9 10*3/uL (ref 4.0–10.5)

## 2015-05-29 LAB — HEMOGLOBIN A1C
Hgb A1c MFr Bld: 7.8 % — ABNORMAL HIGH (ref <5.7)
Mean Plasma Glucose: 177 mg/dL — ABNORMAL HIGH (ref <117)

## 2015-05-29 LAB — VITAMIN D 25 HYDROXY (VIT D DEFICIENCY, FRACTURES): Vit D, 25-Hydroxy: 37 ng/mL (ref 30–100)

## 2015-05-30 ENCOUNTER — Ambulatory Visit (INDEPENDENT_AMBULATORY_CARE_PROVIDER_SITE_OTHER): Payer: BC Managed Care – PPO | Admitting: Internal Medicine

## 2015-05-30 ENCOUNTER — Encounter: Payer: Self-pay | Admitting: Internal Medicine

## 2015-05-30 VITALS — BP 122/84 | HR 75 | Temp 98.0°F | Resp 20 | Ht 64.0 in | Wt 167.0 lb

## 2015-05-30 DIAGNOSIS — R748 Abnormal levels of other serum enzymes: Secondary | ICD-10-CM

## 2015-05-30 DIAGNOSIS — E8881 Metabolic syndrome: Secondary | ICD-10-CM | POA: Diagnosis not present

## 2015-05-30 DIAGNOSIS — J069 Acute upper respiratory infection, unspecified: Secondary | ICD-10-CM

## 2015-05-30 DIAGNOSIS — Z794 Long term (current) use of insulin: Secondary | ICD-10-CM

## 2015-05-30 DIAGNOSIS — E119 Type 2 diabetes mellitus without complications: Secondary | ICD-10-CM | POA: Diagnosis not present

## 2015-05-30 DIAGNOSIS — I10 Essential (primary) hypertension: Secondary | ICD-10-CM

## 2015-05-30 DIAGNOSIS — Z0000 Encounter for general adult medical examination without abnormal findings: Secondary | ICD-10-CM

## 2015-05-30 DIAGNOSIS — E785 Hyperlipidemia, unspecified: Secondary | ICD-10-CM | POA: Diagnosis not present

## 2015-05-30 DIAGNOSIS — N898 Other specified noninflammatory disorders of vagina: Secondary | ICD-10-CM | POA: Diagnosis not present

## 2015-05-30 DIAGNOSIS — E669 Obesity, unspecified: Secondary | ICD-10-CM | POA: Diagnosis not present

## 2015-05-30 DIAGNOSIS — IMO0001 Reserved for inherently not codable concepts without codable children: Secondary | ICD-10-CM

## 2015-05-30 MED FILL — LEVEMIR FLEXTOUCH 100 UNITS: 100 | 30 days supply | Qty: 15 | Fill #1

## 2015-05-30 NOTE — Progress Notes (Signed)
Subjective:    Patient ID: Lisa Robertson, female    DOB: Aug 13, 1955, 60 y.o.   MRN: [id number]  HPI  60 year old Black Female for health maintenance exam and evaluation of medical issues.Has had recent upper respiratory infection. Thinks it's not too terribly bad and doesn't need treatment for it today. No fever or shaking chills. No flulike symptoms. Has been substitute teaching some. Husband has returned to work 3 days a week at [employer].  Reminded about diabetic eye exam-waiting to get eye exam insurance from husband's employment. Wears glasses.  History of bilateral tubal ligation. History of total abdominal hysterectomy without oophorectomy for fibroids by Dr. Milta Deiters 1998.  No known drug allergies.  History of diabetes mellitus which is insulin-dependent, history of hypertension since 1978. She is on statin therapy. History of obesity. Has seen diabetic educator diabetes treatment Center.  Liver functions are mildly elevated. This could be due to recent respiratory infection or fatty liver. We'll check ultrasound of liver and the near future. Has gained 8 pounds since physical exam March 2016. Needs to work on diet and exercise.  Her hemoglobin A1c is excellent. Vitamin D level was normal. CBC is normal. TSH is normal.  Social history: She is married. 2 sons and 1 daughter. Does not smoke or consume alcohol. 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 Husband is an Archivist who retired and is gone back to work part-time.  Family history: Son with history of kidney transplant from uncontrolled hypertension. Father with history of hypertension, prostate cancer and diabetes along with heart disease. Mother with history of diabetes and hypertension. 3 sisters and no brothers. Reminded regarding annual mammogram.   Review of Systems  Constitutional: Negative.   HENT:  Negative.   Respiratory:       Recent acute upper respiratory infection without fever  Cardiovascular: Negative.   Genitourinary:       Vaginal dryness  Neurological: Negative.   Hematological: Negative.   Psychiatric/Behavioral: Negative.        Objective:   Physical Exam  Constitutional: She is oriented to person, place, and time. She appears well-developed and well-nourished. No distress.  HENT:  Head: Normocephalic and atraumatic.  Right Ear: External ear normal.  Left Ear: External ear normal.  Mouth/Throat: Oropharynx is clear and moist.  Eyes: Conjunctivae and EOM are normal. Pupils are equal, round, and reactive to light. Right eye exhibits no discharge. Left eye exhibits no discharge.  Neck: Neck supple. No JVD present. No thyromegaly present.  Cardiovascular: Normal rate, regular rhythm, normal heart sounds and intact distal pulses.   No murmur heard. Pulmonary/Chest: Effort normal and breath sounds normal. No respiratory distress. She has no wheezes. She has no rales.  Breasts normal female  Abdominal: She exhibits no distension and no mass. There is no tenderness. There is no rebound and no guarding.  Genitourinary:  Status post total abdominal hysterectomy. Ovaries remain. Bimanual normal.  Musculoskeletal: She exhibits no edema.  Lymphadenopathy:    She has no cervical adenopathy.  Neurological: She is alert and oriented to person, place, and time. She has normal reflexes. No cranial nerve deficit. Coordination normal.  Skin: Skin is warm and dry. No rash noted. She is not diaphoretic.  Psychiatric: She has a normal mood and affect. Her behavior is normal. Judgment and thought content normal.  Vitals reviewed.  Assessment & Plan:  Health maintenance exam Insulin Dependent Diabetes- AIC is excellent Essential hypertension-Stable Vitamin D deficiency- level normal with supplement Elevated liver functions- check ultraound of liver Obesity-has seen  dietitian. Weight has increased 8 pounds since March 2016 URI-No treatment Vaginal dryness-use Avaglide Hyperlipidemia-continue statin therapy  Plan: Return in 6 months. Continue same regimen. Work on diet and exercise. Needs to lose weight. Have ultrasound of liver in the near future.

## 2015-05-30 NOTE — Patient Instructions (Signed)
Liver functions are slightly elevated which could be due to acute respiratory infection or fatty liver. Have ultrasound of abdomen to look at liver. Return in 6 months for follow-up of diabetes hypertension and hyperlipidemia. Continue diet and exercise efforts.

## 2015-06-07 ENCOUNTER — Other Ambulatory Visit: Payer: BC Managed Care – PPO

## 2015-06-13 ENCOUNTER — Ambulatory Visit
Admission: RE | Admit: 2015-06-13 | Discharge: 2015-06-13 | Disposition: A | Discharge status: Home / Self Care | Payer: BC Managed Care – PPO | Source: Ambulatory Visit | Attending: Internal Medicine | Admitting: Internal Medicine

## 2015-06-13 DIAGNOSIS — R748 Abnormal levels of other serum enzymes: Secondary | ICD-10-CM

## 2015-06-14 ENCOUNTER — Other Ambulatory Visit: Payer: Self-pay | Admitting: Internal Medicine

## 2015-06-14 MED FILL — UNIFINE PENTIPS 32GX5/32: 32G X 4 MM | 30 days supply | Qty: 100 | Fill #0

## 2015-06-14 MED FILL — CARTIA XT 300 MG CAPSULE SA: 300 | 90 days supply | Qty: 90 | Fill #0

## 2015-06-14 MED FILL — LOSARTAN POTASSIUM 100 MG T: 100 | 90 days supply | Qty: 90 | Fill #0

## 2015-06-14 MED FILL — NOVOLOG FLEXPEN SYRINGE: 100 | 41 days supply | Qty: 15 | Fill #0

## 2015-06-14 MED FILL — ATENOLOL 50 MG TABLET: 50 | 90 days supply | Qty: 90 | Fill #0

## 2015-06-14 MED FILL — HYDROCHLOROTHIAZIDE 12.5 MG: 12.5 | 90 days supply | Qty: 180 | Fill #0

## 2015-06-14 MED FILL — SIMVASTATIN 20 MG TABLET: 20 | 90 days supply | Qty: 90 | Fill #0

## 2015-06-14 NOTE — Telephone Encounter (Signed)
Refill x 6 months 

## 2015-06-24 MED FILL — LEVEMIR FLEXTOUCH 100 UNITS: 100 | 30 days supply | Qty: 15 | Fill #2

## 2015-06-25 ENCOUNTER — Ambulatory Visit
Admission: RE | Admit: 2015-06-25 | Discharge: 2015-06-25 | Disposition: A | Discharge status: Home / Self Care | Payer: BC Managed Care – PPO | Source: Ambulatory Visit

## 2015-06-25 DIAGNOSIS — Z1231 Encounter for screening mammogram for malignant neoplasm of breast: Secondary | ICD-10-CM

## 2015-07-11 ENCOUNTER — Other Ambulatory Visit: Payer: Self-pay | Admitting: Internal Medicine

## 2015-07-11 MED FILL — metFORMIN HCL 500 MG TABS: 500 | 30 days supply | Qty: 60 | Fill #0

## 2015-07-11 MED FILL — ONE TOUCH ULTRA TEST STRIPS: 50 days supply | Qty: 200 | Fill #4

## 2015-07-24 MED FILL — NOVOLOG FLEXPEN SYRINGE: 100 | 41 days supply | Qty: 15 | Fill #1

## 2015-07-24 MED FILL — LEVEMIR FLEXTOUCH 100 UNITS: 100 | 30 days supply | Qty: 15 | Fill #3

## 2015-07-24 MED FILL — UNIFINE PENTIPS 32GX5/32: 32G X 4 MM | 30 days supply | Qty: 100 | Fill #1

## 2015-08-28 MED FILL — ONE TOUCH ULTRA TEST STRIPS: 50 days supply | Qty: 200 | Fill #5

## 2015-08-28 MED FILL — LEVEMIR FLEXTOUCH 100 UNITS: 100 | 30 days supply | Qty: 15 | Fill #4

## 2015-09-06 MED FILL — NOVOLOG FLEXPEN SYRINGE: 100 | 41 days supply | Qty: 15 | Fill #2

## 2015-09-06 MED FILL — metFORMIN HCL 500 MG TABS: 500 | 30 days supply | Qty: 60 | Fill #1

## 2015-09-11 MED FILL — SIMVASTATIN 20 MG TABLET: 20 | 90 days supply | Qty: 90 | Fill #1

## 2015-09-11 MED FILL — HYDROCHLOROTHIAZIDE 12.5 MG: 12.5 | 90 days supply | Qty: 180 | Fill #1

## 2015-09-11 MED FILL — CARTIA XT 300 MG CAPSULE SA: 300 | 90 days supply | Qty: 90 | Fill #1

## 2015-09-11 MED FILL — ATENOLOL 50 MG TABLET: 50 | 90 days supply | Qty: 90 | Fill #1

## 2015-09-11 MED FILL — LOSARTAN POTASSIUM 100 MG T: 100 | 90 days supply | Qty: 90 | Fill #1

## 2015-09-27 MED FILL — LEVEMIR FLEXTOUCH 100 UNITS: 100 | 30 days supply | Qty: 15 | Fill #5

## 2015-10-18 MED FILL — ONE TOUCH ULTRA TEST STRIPS: 50 days supply | Qty: 200 | Fill #6

## 2015-10-25 MED FILL — NOVOLOG FLEXPEN SYRINGE: 100 | 41 days supply | Qty: 15 | Fill #3

## 2015-10-25 MED FILL — LEVEMIR FLEXTOUCH 100 UNITS: 100 | 30 days supply | Qty: 15 | Fill #6

## 2015-10-25 MED FILL — metFORMIN HCL 500 MG TABS: 500 | 30 days supply | Qty: 60 | Fill #2

## 2015-11-22 ENCOUNTER — Other Ambulatory Visit: Payer: BC Managed Care – PPO | Admitting: Internal Medicine

## 2015-11-22 DIAGNOSIS — E669 Obesity, unspecified: Secondary | ICD-10-CM

## 2015-11-22 DIAGNOSIS — E119 Type 2 diabetes mellitus without complications: Secondary | ICD-10-CM

## 2015-11-22 DIAGNOSIS — I10 Essential (primary) hypertension: Secondary | ICD-10-CM

## 2015-11-22 DIAGNOSIS — E8881 Metabolic syndrome: Secondary | ICD-10-CM

## 2015-11-22 DIAGNOSIS — IMO0001 Reserved for inherently not codable concepts without codable children: Secondary | ICD-10-CM

## 2015-11-22 DIAGNOSIS — Z794 Long term (current) use of insulin: Secondary | ICD-10-CM

## 2015-11-22 LAB — LIPID PANEL
CHOL/HDL RATIO: 3.2 ratio (ref <=5.0)
CHOLESTEROL: 146 mg/dL (ref 125–200)
HDL: 46 mg/dL (ref >=46)
LDL Cholesterol: 78 mg/dL (ref <130)
Triglycerides: 112 mg/dL (ref <150)
VLDL: 22 mg/dL (ref <30)

## 2015-11-22 LAB — HEPATIC FUNCTION PANEL
ALT: 34 U/L — ABNORMAL HIGH (ref 6–29)
AST: 32 U/L (ref 10–35)
Albumin: 4.1 g/dL (ref 3.6–5.1)
Alkaline Phosphatase: 52 U/L (ref 33–130)
BILIRUBIN DIRECT: 0.1 mg/dL (ref <=0.2)
BILIRUBIN INDIRECT: 0.3 mg/dL (ref 0.2–1.2)
BILIRUBIN TOTAL: 0.4 mg/dL (ref 0.2–1.2)
Total Protein: 7.3 g/dL (ref 6.1–8.1)

## 2015-11-23 LAB — HEMOGLOBIN A1C
HEMOGLOBIN A1C: 6.8 % — AB (ref <5.7)
MEAN PLASMA GLUCOSE: 148 mg/dL

## 2015-11-25 ENCOUNTER — Encounter: Payer: Self-pay | Admitting: Internal Medicine

## 2015-11-26 ENCOUNTER — Other Ambulatory Visit: Payer: BC Managed Care – PPO | Admitting: Internal Medicine

## 2015-11-28 ENCOUNTER — Encounter: Payer: Self-pay | Admitting: Internal Medicine

## 2015-11-28 ENCOUNTER — Ambulatory Visit (INDEPENDENT_AMBULATORY_CARE_PROVIDER_SITE_OTHER): Payer: BC Managed Care – PPO | Admitting: Internal Medicine

## 2015-11-28 VITALS — BP 134/82 | HR 72 | Temp 98.5°F | Ht 64.0 in | Wt 166.0 lb

## 2015-11-28 DIAGNOSIS — IMO0001 Reserved for inherently not codable concepts without codable children: Secondary | ICD-10-CM

## 2015-11-28 DIAGNOSIS — Z794 Long term (current) use of insulin: Secondary | ICD-10-CM | POA: Diagnosis not present

## 2015-11-28 DIAGNOSIS — I10 Essential (primary) hypertension: Secondary | ICD-10-CM

## 2015-11-28 DIAGNOSIS — E8881 Metabolic syndrome: Secondary | ICD-10-CM | POA: Diagnosis not present

## 2015-11-28 DIAGNOSIS — E119 Type 2 diabetes mellitus without complications: Secondary | ICD-10-CM | POA: Diagnosis not present

## 2015-11-28 DIAGNOSIS — E669 Obesity, unspecified: Secondary | ICD-10-CM

## 2015-11-28 MED FILL — metFORMIN HCL 500 MG TABS: 500 | 30 days supply | Qty: 60 | Fill #3

## 2015-11-28 MED FILL — LEVEMIR FLEXTOUCH 100 UNITS: 100 | 30 days supply | Qty: 15 | Fill #7

## 2015-11-28 MED FILL — NOVOLOG FLEXPEN SYRINGE: 100 | 41 days supply | Qty: 15 | Fill #4

## 2015-12-11 ENCOUNTER — Other Ambulatory Visit: Payer: Self-pay | Admitting: *Deleted

## 2015-12-11 MED ORDER — LOSARTAN POTASSIUM 100 MG PO TABS: 100.0000 mg | ORAL_TABLET | Freq: Every day | ORAL | 4 refills | Status: DC

## 2015-12-11 MED ORDER — ATENOLOL 50 MG PO TABS: 50.0000 mg | ORAL_TABLET | Freq: Every day | ORAL | 4 refills | Status: DC

## 2015-12-11 MED ORDER — HYDROCHLOROTHIAZIDE 12.5 MG PO CAPS: 12.5000 mg | ORAL_CAPSULE | Freq: Two times a day (BID) | ORAL | 4 refills | Status: DC

## 2015-12-11 MED ORDER — DILTIAZEM HCL ER COATED BEADS 300 MG PO CP24: 300.0000 mg | ORAL_CAPSULE | Freq: Every day | ORAL | 4 refills | Status: DC

## 2015-12-11 MED ORDER — SIMVASTATIN 20 MG PO TABS: 20.0000 mg | ORAL_TABLET | Freq: Every evening | ORAL | 4 refills | Status: DC

## 2015-12-11 MED FILL — CARTIA XT 300 MG CAPSULE SA: 300 | 90 days supply | Qty: 90 | Fill #0

## 2015-12-11 MED FILL — LOSARTAN POTASSIUM 100 MG T: 100 | 90 days supply | Qty: 90 | Fill #0

## 2015-12-11 MED FILL — ATENOLOL 50 MG TABLET: 50 | 30 days supply | Qty: 30 | Fill #0

## 2015-12-11 MED FILL — HYDROCHLOROTHIAZIDE 12.5 MG: 12.5 | 90 days supply | Qty: 180 | Fill #0

## 2015-12-11 MED FILL — SIMVASTATIN 20 MG TABLET: 20 | 90 days supply | Qty: 90 | Fill #0

## 2015-12-11 NOTE — Patient Instructions (Addendum)
It was a pleasure to see you today. Continue excellent diabetic management and return in 6 months. No change in medications. Recommend annual diabetic eye exam and annual flu vaccine.

## 2015-12-11 NOTE — Progress Notes (Signed)
   Subjective:    Patient ID: Lisa Robertson, female    DOB: [birth year], 60 y.o.   MRN: [id number]  HPI 60 year old 60 female with history of essential hypertension, insulin-dependent diabetes, obesity in today for six-month recheck. Has been doing well past 6 months. No new problems or complaints. She is on simvastatin 20 mg daily, metformin, Cozaar, Levemir and NovoLog. She is also on HCTZ diltiazem and atenolol.  She is status post total abdominal hysterectomy. Ovaries remain.  Had pneumococcal 23 vaccine in 2007. Had flu vaccine in 2016 and tetanus immunization 2012  Due for colonoscopy in the near future. Last one was August 2007. This was done at [facility] endoscopy.  Needs Prevnar 13 and the near future.  Reminded about diabetic eye exam.  Weight 166 pounds and previously was 167 pounds in March 2017.  She is retired from the school system. Husband retired from [employer] but recently returned to work for [employer] purposes.  Hemoglobin A1c excellent at 6.8% in 6 months ago was 7.8%. Liver panel normal. Lipid panel normal.    Review of Systems as above     Objective:   Physical Exam Skin warm and dry. Nodes none. Neck is supple without JVD thyromegaly or carotid bruits. Chest clear to auscultation. Cardiac exam regular rate and rhythm normal S1 and S2. Extremities without edema.       Assessment & Plan:  Controlled type 2 diabetes mellitus. Her control is now excellent at 6.8%. Continue same medications.  Obesity-continue diet and exercise efforts  Essential hypertension stable on current regimen  Lipid control-patient is on statin medication due to diabetes mellitus. Lipid panel liver functions are within normal limits.  Reminded about diabetic eye exam an upcoming colonoscopy. Needs Prevnar 13. Otherwise return in 6 months. Recommend annual flu vaccine.

## 2015-12-12 ENCOUNTER — Other Ambulatory Visit: Payer: Self-pay | Admitting: Internal Medicine

## 2015-12-12 DIAGNOSIS — Z794 Long term (current) use of insulin: Principal | ICD-10-CM

## 2015-12-12 DIAGNOSIS — E119 Type 2 diabetes mellitus without complications: Principal | ICD-10-CM

## 2015-12-12 DIAGNOSIS — IMO0001 Reserved for inherently not codable concepts without codable children: Secondary | ICD-10-CM

## 2015-12-12 MED FILL — ONE TOUCH ULTRA TEST STRIPS: 50 days supply | Qty: 200 | Fill #0

## 2015-12-26 MED FILL — LEVEMIR FLEXTOUCH 100 UNITS: 100 | 30 days supply | Qty: 15 | Fill #8

## 2016-01-09 MED FILL — ATENOLOL 50 MG TABLET: 50 | 30 days supply | Qty: 30 | Fill #1

## 2016-01-09 MED FILL — metFORMIN HCL 500 MG TABS: 500 | 30 days supply | Qty: 60 | Fill #4

## 2016-01-20 MED FILL — NOVOLOG FLEXPEN SYRINGE: 100 | 42 days supply | Qty: 15 | Fill #0

## 2016-01-29 MED FILL — LEVEMIR FLEXTOUCH 100 UNITS: 100 | 30 days supply | Qty: 15 | Fill #9

## 2016-01-29 MED FILL — ONE TOUCH ULTRA TEST STRIPS: 50 days supply | Qty: 200 | Fill #1

## 2016-02-13 MED FILL — metFORMIN HCL 500 MG TABS: 500 | 30 days supply | Qty: 60 | Fill #5

## 2016-02-28 MED FILL — LEVEMIR FLEXTOUCH 100 UNITS: 100 | 30 days supply | Qty: 15 | Fill #10

## 2016-03-04 MED FILL — NOVOLOG FLEXPEN SYRINGE: 100 | 42 days supply | Qty: 15 | Fill #1

## 2016-03-12 MED FILL — metFORMIN HCL 500 MG TABS: 500 | 30 days supply | Qty: 60 | Fill #6

## 2016-03-12 MED FILL — HYDROCHLOROTHIAZIDE 12.5 MG: 12.5 | 90 days supply | Qty: 180 | Fill #1

## 2016-03-12 MED FILL — CARTIA XT 300 MG CAPSULE SA: 300 | 90 days supply | Qty: 90 | Fill #1

## 2016-03-12 MED FILL — LOSARTAN POTASSIUM 100 MG T: 100 | 90 days supply | Qty: 90 | Fill #1

## 2016-03-12 MED FILL — ATENOLOL 50 MG TABLET: 50 | 30 days supply | Qty: 30 | Fill #2

## 2016-03-12 MED FILL — ONE TOUCH ULTRA TEST STRIPS: 50 days supply | Qty: 200 | Fill #2

## 2016-03-12 MED FILL — SIMVASTATIN 20 MG TABLET: 20 | 90 days supply | Qty: 90 | Fill #1

## 2016-03-30 MED FILL — LEVEMIR FLEXTOUCH 100 UNITS: 100 | 30 days supply | Qty: 15 | Fill #11

## 2016-04-14 MED FILL — ATENOLOL 50 MG TABLET: 50 | 30 days supply | Qty: 30 | Fill #3

## 2016-04-21 MED FILL — NOVOLOG FLEXPEN SYRINGE: 100 | 42 days supply | Qty: 15 | Fill #2

## 2016-04-29 ENCOUNTER — Other Ambulatory Visit: Payer: Self-pay | Admitting: Internal Medicine

## 2016-04-29 MED FILL — ONE TOUCH ULTRA TEST STRIPS: 50 days supply | Qty: 200 | Fill #3

## 2016-04-29 MED FILL — metFORMIN HCL 500 MG TABS: 500 | 30 days supply | Qty: 60 | Fill #7

## 2016-04-30 ENCOUNTER — Other Ambulatory Visit: Payer: Self-pay

## 2016-04-30 DIAGNOSIS — Z794 Long term (current) use of insulin: Principal | ICD-10-CM

## 2016-04-30 DIAGNOSIS — IMO0001 Reserved for inherently not codable concepts without codable children: Secondary | ICD-10-CM

## 2016-04-30 DIAGNOSIS — E119 Type 2 diabetes mellitus without complications: Principal | ICD-10-CM

## 2016-04-30 MED ORDER — ONETOUCH ULTRA BLUE VI STRP: ORAL_STRIP | 99 refills | Status: DC

## 2016-04-30 MED FILL — LEVEMIR FLEXTOUCH 100 UNITS: 100 | 30 days supply | Qty: 15 | Fill #0

## 2016-05-19 ENCOUNTER — Other Ambulatory Visit: Payer: Self-pay | Admitting: Internal Medicine

## 2016-05-19 DIAGNOSIS — Z1231 Encounter for screening mammogram for malignant neoplasm of breast: Secondary | ICD-10-CM

## 2016-05-28 MED FILL — ATENOLOL 50 MG TABLET: 50 | 30 days supply | Qty: 30 | Fill #4

## 2016-05-28 MED FILL — LEVEMIR FLEXTOUCH 100 UNITS: 100 | 30 days supply | Qty: 15 | Fill #1

## 2016-05-28 MED FILL — metFORMIN HCL 500 MG TABS: 500 | 30 days supply | Qty: 60 | Fill #8

## 2016-05-28 MED FILL — NOVOLOG FLEXPEN SYRINGE: 100 | 42 days supply | Qty: 15 | Fill #3

## 2016-05-29 ENCOUNTER — Other Ambulatory Visit: Payer: BC Managed Care – PPO | Admitting: Internal Medicine

## 2016-05-29 ENCOUNTER — Other Ambulatory Visit: Payer: Self-pay | Admitting: Internal Medicine

## 2016-05-29 DIAGNOSIS — E119 Type 2 diabetes mellitus without complications: Secondary | ICD-10-CM

## 2016-05-29 DIAGNOSIS — I10 Essential (primary) hypertension: Secondary | ICD-10-CM

## 2016-05-29 DIAGNOSIS — Z13220 Encounter for screening for lipoid disorders: Secondary | ICD-10-CM

## 2016-05-29 DIAGNOSIS — Z1329 Encounter for screening for other suspected endocrine disorder: Secondary | ICD-10-CM

## 2016-05-29 DIAGNOSIS — Z1321 Encounter for screening for nutritional disorder: Secondary | ICD-10-CM

## 2016-05-29 DIAGNOSIS — Z794 Long term (current) use of insulin: Secondary | ICD-10-CM

## 2016-05-29 DIAGNOSIS — IMO0001 Reserved for inherently not codable concepts without codable children: Secondary | ICD-10-CM

## 2016-05-29 DIAGNOSIS — Z0000 Encounter for general adult medical examination without abnormal findings: Secondary | ICD-10-CM

## 2016-05-29 LAB — COMPLETE METABOLIC PANEL WITH GFR
ALBUMIN: 4 g/dL (ref 3.6–5.1)
ALK PHOS: 52 U/L (ref 33–130)
ALT: 34 U/L — ABNORMAL HIGH (ref 6–29)
AST: 31 U/L (ref 10–35)
BILIRUBIN TOTAL: 0.5 mg/dL (ref 0.2–1.2)
BUN: 20 mg/dL (ref 7–25)
CO2: 26 mmol/L (ref 20–31)
Calcium: 9.7 mg/dL (ref 8.6–10.4)
Chloride: 101 mmol/L (ref 98–110)
Creat: 0.69 mg/dL (ref 0.50–0.99)
GFR, Est African American: >89 mL/min (ref >=60)
GFR, Est Non African American: >89 mL/min (ref >=60)
GLUCOSE: 119 mg/dL — AB (ref 65–99)
Potassium: 3.8 mmol/L (ref 3.5–5.3)
SODIUM: 139 mmol/L (ref 135–146)
TOTAL PROTEIN: 7.5 g/dL (ref 6.1–8.1)

## 2016-05-29 LAB — CBC WITH DIFFERENTIAL/PLATELET
BASOS ABS: 63 {cells}/uL (ref 0–200)
Basophils Relative: 1 %
EOS PCT: 6 %
Eosinophils Absolute: 378 cells/uL (ref 15–500)
HCT: 42.7 % (ref 35.0–45.0)
HEMOGLOBIN: 14 g/dL (ref 11.7–15.5)
Lymphocytes Relative: 47 %
Lymphs Abs: 2961 cells/uL (ref 850–3900)
MCH: 29.5 pg (ref 27.0–33.0)
MCHC: 32.8 g/dL (ref 32.0–36.0)
MCV: 90.1 fL (ref 80.0–100.0)
MONOS PCT: 7 %
MPV: 9.7 fL (ref 7.5–12.5)
Monocytes Absolute: 441 cells/uL (ref 200–950)
NEUTROS PCT: 39 %
Neutro Abs: 2457 cells/uL (ref 1500–7800)
PLATELETS: 226 10*3/uL (ref 140–400)
RBC: 4.74 MIL/uL (ref 3.80–5.10)
RDW: 14 % (ref 11.0–15.0)
WBC: 6.3 10*3/uL (ref 3.8–10.8)

## 2016-05-29 LAB — LIPID PANEL
CHOLESTEROL: 157 mg/dL (ref <200)
HDL: 46 mg/dL — ABNORMAL LOW (ref >50)
LDL Cholesterol: 85 mg/dL (ref <100)
Total CHOL/HDL Ratio: 3.4 Ratio (ref <5.0)
Triglycerides: 129 mg/dL (ref <150)
VLDL: 26 mg/dL (ref <30)

## 2016-05-29 NOTE — Progress Notes (Signed)
Labs drawn

## 2016-05-30 LAB — TSH: TSH: 1.18 mIU/L

## 2016-05-30 LAB — VITAMIN D 25 HYDROXY (VIT D DEFICIENCY, FRACTURES): Vit D, 25-Hydroxy: 46 ng/mL (ref 30–100)

## 2016-05-30 LAB — HEMOGLOBIN A1C
HEMOGLOBIN A1C: 6.3 % — AB (ref <5.7)
MEAN PLASMA GLUCOSE: 134 mg/dL

## 2016-06-02 ENCOUNTER — Encounter: Payer: Self-pay | Admitting: Internal Medicine

## 2016-06-02 ENCOUNTER — Other Ambulatory Visit (HOSPITAL_COMMUNITY)
Admission: RE | Admit: 2016-06-02 | Discharge: 2016-06-02 | Disposition: A | Discharge status: Home / Self Care | Payer: BC Managed Care – PPO | Source: Ambulatory Visit | Attending: Internal Medicine | Admitting: Internal Medicine

## 2016-06-02 ENCOUNTER — Ambulatory Visit (INDEPENDENT_AMBULATORY_CARE_PROVIDER_SITE_OTHER): Payer: BC Managed Care – PPO | Admitting: Internal Medicine

## 2016-06-02 ENCOUNTER — Other Ambulatory Visit: Payer: BC Managed Care – PPO | Admitting: Internal Medicine

## 2016-06-02 VITALS — BP 100/78 | HR 67 | Temp 98.4°F | Ht 62.25 in | Wt 168.0 lb

## 2016-06-02 DIAGNOSIS — E784 Other hyperlipidemia: Secondary | ICD-10-CM

## 2016-06-02 DIAGNOSIS — Z0000 Encounter for general adult medical examination without abnormal findings: Secondary | ICD-10-CM

## 2016-06-02 DIAGNOSIS — E7849 Other hyperlipidemia: Secondary | ICD-10-CM

## 2016-06-02 DIAGNOSIS — E8881 Metabolic syndrome: Secondary | ICD-10-CM

## 2016-06-02 DIAGNOSIS — Z01419 Encounter for gynecological examination (general) (routine) without abnormal findings: Secondary | ICD-10-CM | POA: Insufficient documentation

## 2016-06-02 DIAGNOSIS — Z794 Long term (current) use of insulin: Secondary | ICD-10-CM

## 2016-06-02 DIAGNOSIS — E119 Type 2 diabetes mellitus without complications: Secondary | ICD-10-CM

## 2016-06-02 DIAGNOSIS — I10 Essential (primary) hypertension: Secondary | ICD-10-CM

## 2016-06-02 DIAGNOSIS — M720 Palmar fascial fibromatosis [Dupuytren]: Secondary | ICD-10-CM | POA: Diagnosis not present

## 2016-06-02 DIAGNOSIS — IMO0001 Reserved for inherently not codable concepts without codable children: Secondary | ICD-10-CM

## 2016-06-02 DIAGNOSIS — Z6830 Body mass index (BMI) 30.0-30.9, adult: Secondary | ICD-10-CM | POA: Diagnosis not present

## 2016-06-02 NOTE — Patient Instructions (Addendum)
To see Dr. Latanya Maudlin  regarding trigger finger right hand. To have mammogram in the near future. Return in 6 months. Continue same medications. It was a pleasure to see you today.

## 2016-06-04 ENCOUNTER — Other Ambulatory Visit: Payer: Self-pay | Admitting: Family Medicine

## 2016-06-04 ENCOUNTER — Other Ambulatory Visit: Payer: Self-pay | Admitting: Internal Medicine

## 2016-06-04 DIAGNOSIS — B3731 Acute candidiasis of vulva and vagina: Secondary | ICD-10-CM

## 2016-06-04 DIAGNOSIS — B373 Candidiasis of vulva and vagina: Secondary | ICD-10-CM

## 2016-06-04 LAB — MICROALBUMIN / CREATININE URINE RATIO
Creatinine, Urine: 127 mg/dL (ref 20–320)
MICROALB UR: 0.9 mg/dL
Microalb Creat Ratio: 7 mcg/mg creat (ref <30)

## 2016-06-04 LAB — CYTOLOGY - PAP: DIAGNOSIS: NEGATIVE

## 2016-06-04 MED ORDER — FLUCONAZOLE 150 MG PO TABS: 150.0000 mg | ORAL_TABLET | Freq: Once | ORAL | 0 refills | Status: AC

## 2016-06-04 MED FILL — FLUCONAZOLE 150 MG TABLET: 150 | 1 days supply | Qty: 1 | Fill #0

## 2016-06-09 ENCOUNTER — Telehealth: Payer: Self-pay

## 2016-06-09 ENCOUNTER — Telehealth: Payer: Self-pay | Admitting: Internal Medicine

## 2016-06-09 NOTE — Telephone Encounter (Signed)
Called to give patient appointment date/time with Dr. Daryll Brod for Right 4th finger contracture.  Had spoken with Rise Paganini at Dr. Lucie Leather office and have appointment for 4/18 @ 2pm (arrive @ 1:45).    Husband advised that patient has an orthopedist already and has seen Dr. Rhona Raider in the past.  She had forgotten this.  He advised that he DOES do hands as well.  And, she has already called and made an appointment to see Dr. Rhona Raider regarding her hand.    Faxing information to Dr. Lucie Leather office @ [phone]; attention Rise Paganini to cancel this appointment for 4/18.

## 2016-06-09 NOTE — Telephone Encounter (Signed)
Patient called states we will be receiving a new rx request for new test strips for a new meter that was given to her at her weight loss program, she said can we please refill it and send it back to the outpatient [facility].

## 2016-06-10 MED FILL — LOSARTAN POTASSIUM 100 MG T: 100 | 90 days supply | Qty: 90 | Fill #2

## 2016-06-10 MED FILL — SIMVASTATIN 20 MG TABLET: 20 | 90 days supply | Qty: 90 | Fill #2

## 2016-06-10 MED FILL — HYDROCHLOROTHIAZIDE 12.5 MG: 12.5 | 90 days supply | Qty: 180 | Fill #2

## 2016-06-10 MED FILL — CARTIA XT 300 MG CAPSULE SA: 300 | 90 days supply | Qty: 90 | Fill #2

## 2016-06-12 NOTE — Progress Notes (Signed)
   Subjective:    Patient ID: Lisa Robertson, female    DOB: 08-05-1955, 61 y.o.   MRN: [id number]  HPI 61 year old Black Female in today for health maintenance exam and evaluation of medical issues. She's having an issue with her right fourth finger. She will be referred to Dr. Latanya Maudlin. Appears to be a trigger finger.  History of bilateral tubal ligation. History of total abdominal hysterectomy without oophorectomy for fibroids by Dr. Nori Riis in 1998. History of diabetes mellitus. She is insulin-dependent. History of hypertension since 1978. She is on statin therapy. History of obesity. Has seen diabetic educator at the [place].  Wears glasses. Reminded about annual diabetic eye exam.  Social history: She's married. 2 sons and 1 daughter. Does not smoke or consume alcohol. 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 Husband is an Archivist who retired and return to work part-time.  Family history: Son with history of kidney transplant from uncontrolled hypertension. Father with history of hypertension, prostate cancer and diabetes along with heart disease. Mother with history of diabetes and hypertension. 3 sisters and no brothers.  Reminded about annual mammogram.      Review of Systems  Constitutional: Negative.   Respiratory: Negative.   Cardiovascular: Negative.   Genitourinary: Negative.   Musculoskeletal:       Right fourth finger-trigger finger  Neurological: Negative.   Psychiatric/Behavioral: Negative.        Objective:   Physical Exam  Constitutional: She is oriented to person, place, and time. She appears well-developed and well-nourished. No distress.  HENT:  Head: Normocephalic and atraumatic.  Right Ear: External ear normal.  Left Ear: External ear normal.  Mouth/Throat: Oropharynx is clear and moist. No oropharyngeal exudate.  Eyes:  Conjunctivae and EOM are normal. Pupils are equal, round, and reactive to light. Right eye exhibits no discharge. Left eye exhibits no discharge. No scleral icterus.  Neck: Neck supple. No JVD present. No thyromegaly present.  Cardiovascular: Normal rate, regular rhythm, normal heart sounds and intact distal pulses.   No murmur heard. Pulmonary/Chest: Breath sounds normal. No respiratory distress. She has no wheezes. She has no rales.  Abdominal: Soft. Bowel sounds are normal. She exhibits no distension and no mass. There is no tenderness. There is no rebound and no guarding.  Genitourinary:  Genitourinary Comments: Bimanual exam normal. Pap deferred due to hysterectomy  Musculoskeletal: She exhibits no edema.  Lymphadenopathy:    She has no cervical adenopathy.  Neurological: She is alert and oriented to person, place, and time. She has normal reflexes. No cranial nerve deficit. Coordination normal.  Skin: Skin is warm and dry. No rash noted. She is not diaphoretic.  Psychiatric: She has a normal mood and affect. Her behavior is normal. Judgment and thought content normal.  Vitals reviewed.         Assessment & Plan:  Normal health maintenance exam  Insulin-dependent diabetes mellitus-stable on current regimen. Hemoglobin 6.3% and previously was 6.8%  Essential hypertension-stable  History of vitamin D deficiency  Obesity-has seen dietitian  Hyperlipidemia-continue statin therapy. Lipids normal. Has HDL cholesterol low it 46.  Very mild elevation of SGPT at 34. This is stable from last check 6 months ago.  Plan: Return in 6 months or as needed. Continue diet exercise and weight loss regimen.

## 2016-06-25 ENCOUNTER — Ambulatory Visit
Admission: RE | Admit: 2016-06-25 | Discharge: 2016-06-25 | Disposition: A | Discharge status: Home / Self Care | Payer: BC Managed Care – PPO | Source: Ambulatory Visit | Attending: Internal Medicine | Admitting: Internal Medicine

## 2016-06-25 ENCOUNTER — Other Ambulatory Visit: Payer: Self-pay | Admitting: Internal Medicine

## 2016-06-25 DIAGNOSIS — Z1231 Encounter for screening mammogram for malignant neoplasm of breast: Secondary | ICD-10-CM

## 2016-06-25 MED FILL — metFORMIN HCL 500 MG TABS: 500 | 30 days supply | Qty: 60 | Fill #9

## 2016-06-25 MED FILL — LEVEMIR FLEXTOUCH 100 UNITS: 100 | 30 days supply | Qty: 15 | Fill #2

## 2016-06-25 MED FILL — BD PEN NDL NANO 32GX5/32": 32G X 4 MM | 25 days supply | Qty: 100 | Fill #0

## 2016-06-25 MED FILL — ATENOLOL 50 MG TABLET: 50 | 30 days supply | Qty: 30 | Fill #5

## 2016-06-25 MED FILL — BD PEN NDL NANO 32GX5/32: 32G X 4 MM | 25 days supply | Qty: 100 | Fill #0

## 2016-06-26 MED FILL — ONE TOUCH ULTRA TEST STRIPS: 50 days supply | Qty: 200 | Fill #4

## 2016-07-14 MED FILL — NOVOLOG FLEXPEN SYRINGE: 100 | 42 days supply | Qty: 15 | Fill #4

## 2016-07-15 LAB — HM DIABETES EYE EXAM

## 2016-07-24 ENCOUNTER — Other Ambulatory Visit: Payer: Self-pay | Admitting: Internal Medicine

## 2016-07-24 MED FILL — ATENOLOL 50 MG TABLET: 50 | 30 days supply | Qty: 30 | Fill #6

## 2016-07-24 MED FILL — LEVEMIR FLEXTOUCH 100 UNITS: 100 | 30 days supply | Qty: 15 | Fill #3

## 2016-07-27 MED FILL — metFORMIN HCL 500 MG TABS: 500 | 30 days supply | Qty: 60 | Fill #0

## 2016-08-11 MED FILL — ONE TOUCH ULTRA TEST STRIPS: 50 days supply | Qty: 200 | Fill #5

## 2016-08-11 MED FILL — BD PEN NDL NANO 32GX5/32": 32G X 4 MM | 25 days supply | Qty: 100 | Fill #1

## 2016-08-11 MED FILL — BD PEN NDL NANO 32GX5/32: 32G X 4 MM | 25 days supply | Qty: 100 | Fill #1

## 2016-08-17 ENCOUNTER — Encounter: Payer: Self-pay | Admitting: Internal Medicine

## 2016-08-17 ENCOUNTER — Ambulatory Visit (INDEPENDENT_AMBULATORY_CARE_PROVIDER_SITE_OTHER): Payer: BC Managed Care – PPO | Admitting: Internal Medicine

## 2016-08-17 VITALS — BP 116/74 | HR 87 | Temp 98.4°F | Wt 168.0 lb

## 2016-08-17 DIAGNOSIS — J069 Acute upper respiratory infection, unspecified: Secondary | ICD-10-CM

## 2016-08-17 DIAGNOSIS — E119 Type 2 diabetes mellitus without complications: Secondary | ICD-10-CM | POA: Diagnosis not present

## 2016-08-17 DIAGNOSIS — IMO0001 Reserved for inherently not codable concepts without codable children: Secondary | ICD-10-CM

## 2016-08-17 DIAGNOSIS — Z794 Long term (current) use of insulin: Secondary | ICD-10-CM | POA: Diagnosis not present

## 2016-08-17 MED ORDER — METHYLPREDNISOLONE ACETATE 80 MG/ML IJ SUSP
80.0000 mg | Freq: Once | INTRAMUSCULAR | Status: AC
  Administered 2016-08-17: 80 mg via INTRAMUSCULAR

## 2016-08-17 MED ORDER — AMOXICILLIN-POT CLAVULANATE 875-125 MG PO TABS: 1.0000 | ORAL_TABLET | Freq: Two times a day (BID) | ORAL | 0 refills | Status: DC

## 2016-08-17 MED ORDER — BENZONATATE 100 MG PO CAPS: 100.0000 mg | ORAL_CAPSULE | Freq: Three times a day (TID) | ORAL | 1 refills | Status: DC | PRN

## 2016-08-17 MED FILL — AMOX-CLAV 875-125 MG TABLET: 875-125 | 10 days supply | Qty: 20 | Fill #0

## 2016-08-17 MED FILL — BENZONATATE 100 MG CAP: 100 | 10 days supply | Qty: 30 | Fill #0

## 2016-08-17 NOTE — Patient Instructions (Signed)
Augmentin 875 mg twice daily for 10 days. Depo-Medrol 80 mg IM. Tessalon Perles 100 mg 3 times daily as needed for cough.

## 2016-08-17 NOTE — Progress Notes (Signed)
   Subjective:    Patient ID: Lisa Robertson, female    DOB: January 12, 1956, 61 y.o.   MRN: [id number]  HPI 61 year old Female with URI symptoms for several days. Had some clear nasal discharge before getting worse. Has been coughing some after she went on a trip to [place] and it was raining. Cough is not productive. No fever or shaking chills. Some malaise but not a lot. No significant sore throat or ear pain.    Review of Systems see above     Objective:   Physical Exam TMs are slightly full bilaterally. Pharynx is clear. Neck is supple. Chest clear to auscultation. No rales or wheezing noted.       Assessment & Plan:  Acute URI  History of diabetes mellitus  Plan: Augmentin 875 mg twice daily for 10 days. Tessalon Perles 100 mg 3 times daily as needed for cough. Depo-Medrol 80 mg IM. Call if not better in 7-10 days or sooner if worse.

## 2016-08-27 MED FILL — NOVOLOG FLEXPEN SYRINGE: 100 | 42 days supply | Qty: 15 | Fill #5

## 2016-08-27 MED FILL — ATENOLOL 50 MG TABLET: 50 | 30 days supply | Qty: 30 | Fill #7

## 2016-08-27 MED FILL — metFORMIN HCL 500 MG TABS: 500 | 30 days supply | Qty: 60 | Fill #1

## 2016-08-27 MED FILL — LEVEMIR FLEXTOUCH 100 UNITS: 100 | 30 days supply | Qty: 15 | Fill #4

## 2016-09-03 ENCOUNTER — Encounter: Payer: BC Managed Care – PPO | Admitting: Internal Medicine

## 2016-09-10 MED FILL — BD PEN NDL NANO 32GX5/32: 32G X 4 MM | 25 days supply | Qty: 100 | Fill #2

## 2016-09-10 MED FILL — HYDROCHLOROTHIAZIDE 12.5 MG: 12.5 | 90 days supply | Qty: 180 | Fill #3

## 2016-09-10 MED FILL — LOSARTAN POTASSIUM 100 MG T: 100 | 90 days supply | Qty: 90 | Fill #3

## 2016-09-10 MED FILL — BD PEN NDL NANO 32GX5/32": 32G X 4 MM | 25 days supply | Qty: 100 | Fill #2

## 2016-09-10 MED FILL — CARTIA XT 300 MG CAPSULE SA: 300 | 90 days supply | Qty: 90 | Fill #3

## 2016-09-10 MED FILL — SIMVASTATIN 20 MG TABLET: 20 | 90 days supply | Qty: 90 | Fill #3

## 2016-09-18 MED FILL — ONE TOUCH ULTRA TEST STRIPS: 50 days supply | Qty: 200 | Fill #6

## 2016-09-21 ENCOUNTER — Telehealth: Payer: Self-pay | Admitting: Internal Medicine

## 2016-09-21 NOTE — Telephone Encounter (Signed)
LMTCB

## 2016-09-21 NOTE — Telephone Encounter (Signed)
She needs to take meds on the new time zone

## 2016-09-21 NOTE — Telephone Encounter (Signed)
Patient would like a return call concerning her meds.  She is going out of town and will be in a time zone three hours behind [place] time and she wants to know when to take her meds on that time zone.

## 2016-09-21 NOTE — Telephone Encounter (Signed)
Pt is aware and has no questions or concerns at this time  

## 2016-09-25 MED FILL — metFORMIN HCL 500 MG TABS: 500 | 30 days supply | Qty: 60 | Fill #2

## 2016-09-25 MED FILL — ATENOLOL 50 MG TABLET: 50 | 30 days supply | Qty: 30 | Fill #8

## 2016-09-25 MED FILL — LEVEMIR FLEXTOUCH 100 UNITS: 100 | 30 days supply | Qty: 15 | Fill #5

## 2016-09-25 MED FILL — BENZONATATE 100 MG CAP: 100 | 10 days supply | Qty: 30 | Fill #1

## 2016-10-09 MED FILL — NOVOLOG FLEXPEN SYRINGE: 100 | 42 days supply | Qty: 15 | Fill #6

## 2016-10-09 MED FILL — BD PEN NDL NANO 32GX5/32: 32G X 4 MM | 25 days supply | Qty: 100 | Fill #3

## 2016-10-09 MED FILL — BD PEN NDL NANO 32GX5/32": 32G X 4 MM | 25 days supply | Qty: 100 | Fill #3

## 2016-10-26 MED FILL — LEVEMIR FLEXTOUCH 100 UNITS: 100 | 30 days supply | Qty: 15 | Fill #6

## 2016-10-26 MED FILL — metFORMIN HCL 500 MG TABS: 500 | 30 days supply | Qty: 60 | Fill #3

## 2016-10-26 MED FILL — ATENOLOL 50 MG TABLET: 50 | 30 days supply | Qty: 30 | Fill #9

## 2016-11-03 ENCOUNTER — Ambulatory Visit (INDEPENDENT_AMBULATORY_CARE_PROVIDER_SITE_OTHER): Payer: BC Managed Care – PPO | Admitting: Internal Medicine

## 2016-11-03 ENCOUNTER — Encounter: Payer: Self-pay | Admitting: Internal Medicine

## 2016-11-03 VITALS — BP 154/88 | HR 83 | Ht 62.25 in | Wt 168.0 lb

## 2016-11-03 DIAGNOSIS — T148XXA Other injury of unspecified body region, initial encounter: Secondary | ICD-10-CM

## 2016-11-03 DIAGNOSIS — X503XXA Overexertion from repetitive movements, initial encounter: Secondary | ICD-10-CM

## 2016-11-03 MED ORDER — INDOMETHACIN 50 MG PO CAPS: 50.0000 mg | ORAL_CAPSULE | Freq: Three times a day (TID) | ORAL | 0 refills | Status: DC

## 2016-11-03 MED FILL — INDOMETHACIN 50 MG CAPSULE: 50 | 10 days supply | Qty: 30 | Fill #0

## 2016-11-03 MED FILL — BD PEN NDL NANO 32GX5/32: 32G X 4 MM | 25 days supply | Qty: 100 | Fill #4

## 2016-11-03 MED FILL — BD PEN NDL NANO 32GX5/32": 32G X 4 MM | 25 days supply | Qty: 100 | Fill #4

## 2016-11-03 NOTE — Progress Notes (Signed)
   Subjective:    Patient ID: Lisa Robertson, female    DOB: February 20, 1956, 61 y.o.   MRN: [id number]  HPI 61 year old 61 female has been doing a lot of cooking this past weekend and a church function. She started on Friday, August 17 and cook for 2 or 3 hours. On Saturday she cooked for some 6 hours. There were a lot of heavy pots that she had to lift. 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  Today she is having issues raising her left upper extremity. It's painful. It is not numb. She noticed some stiffness as well yesterday. She tried some over-the-counter anti-inflammatories without much relief.    Review of Systems see above-is never had any type of injury likely as     Objective:   Physical Exam She can raise her left arm up over her head but it takes a lot of effort. I think it secondary to pain. Her grip is normal. Her muscle strength is 4/5 in the upper extremity. She has some palpable tenderness in the left medial arm area.       Assessment & Plan:  Overuse syndrome  Plan: Indocin 50 mg 3 times daily with a meal #30 with no refill. If not getting better next week consider physical therapy. Apply ice or heat for 20 minutes 2 or 3 times daily.

## 2016-11-03 NOTE — Patient Instructions (Signed)
Take Indocin 50 mg with food 3 times daily up to 10 days. Apply ice or heat 2-3 times daily. Call if not better next week.

## 2016-11-09 MED FILL — ONE TOUCH ULTRA TEST STRIPS: 50 days supply | Qty: 200 | Fill #7

## 2016-11-24 MED FILL — BD PEN NDL NANO 32GX5/32: 32G X 4 MM | 25 days supply | Qty: 100 | Fill #5

## 2016-11-24 MED FILL — LEVEMIR FLEXTOUCH 100 UNITS: 100 | 30 days supply | Qty: 15 | Fill #7

## 2016-11-24 MED FILL — BD PEN NDL NANO 32GX5/32": 32G X 4 MM | 25 days supply | Qty: 100 | Fill #5

## 2016-11-24 MED FILL — NOVOLOG FLEXPEN SYRINGE: 100 | 42 days supply | Qty: 15 | Fill #7

## 2016-11-24 MED FILL — ATENOLOL 50 MG TABLET: 50 | 30 days supply | Qty: 30 | Fill #10

## 2016-11-24 MED FILL — metFORMIN HCL 500 MG TABS: 500 | 30 days supply | Qty: 60 | Fill #4

## 2016-12-01 ENCOUNTER — Other Ambulatory Visit: Payer: BC Managed Care – PPO | Admitting: Internal Medicine

## 2016-12-01 DIAGNOSIS — Z794 Long term (current) use of insulin: Principal | ICD-10-CM

## 2016-12-01 DIAGNOSIS — IMO0001 Reserved for inherently not codable concepts without codable children: Secondary | ICD-10-CM

## 2016-12-01 DIAGNOSIS — E785 Hyperlipidemia, unspecified: Secondary | ICD-10-CM

## 2016-12-01 DIAGNOSIS — E119 Type 2 diabetes mellitus without complications: Principal | ICD-10-CM

## 2016-12-02 LAB — HEPATIC FUNCTION PANEL
AG Ratio: 1.3 (calc) (ref 1.0–2.5)
ALT: 44 U/L — AB (ref 6–29)
AST: 37 U/L — AB (ref 10–35)
Albumin: 4.2 g/dL (ref 3.6–5.1)
Alkaline phosphatase (APISO): 52 U/L (ref 33–130)
Bilirubin, Direct: 0.1 mg/dL (ref 0.0–0.2)
GLOBULIN: 3.2 g/dL (ref 1.9–3.7)
Indirect Bilirubin: 0.3 mg/dL (calc) (ref 0.2–1.2)
TOTAL PROTEIN: 7.4 g/dL (ref 6.1–8.1)
Total Bilirubin: 0.4 mg/dL (ref 0.2–1.2)

## 2016-12-02 LAB — LIPID PANEL
CHOL/HDL RATIO: 3.5 (calc) (ref <5.0)
Cholesterol: 152 mg/dL (ref <200)
HDL: 44 mg/dL — AB (ref >50)
LDL Cholesterol (Calc): 86 mg/dL (calc)
NON-HDL CHOLESTEROL (CALC): 108 mg/dL (ref <130)
Triglycerides: 121 mg/dL (ref <150)

## 2016-12-02 LAB — HEMOGLOBIN A1C
HEMOGLOBIN A1C: 6.3 %{Hb} — AB (ref <5.7)
Mean Plasma Glucose: 134 (calc)
eAG (mmol/L): 7.4 (calc)

## 2016-12-03 ENCOUNTER — Ambulatory Visit (INDEPENDENT_AMBULATORY_CARE_PROVIDER_SITE_OTHER): Payer: BC Managed Care – PPO | Admitting: Internal Medicine

## 2016-12-03 ENCOUNTER — Encounter: Payer: Self-pay | Admitting: Internal Medicine

## 2016-12-03 VITALS — BP 108/64 | HR 72 | Temp 98.4°F | Wt 167.0 lb

## 2016-12-03 DIAGNOSIS — I10 Essential (primary) hypertension: Secondary | ICD-10-CM

## 2016-12-03 DIAGNOSIS — Z794 Long term (current) use of insulin: Secondary | ICD-10-CM

## 2016-12-03 DIAGNOSIS — E119 Type 2 diabetes mellitus without complications: Secondary | ICD-10-CM | POA: Diagnosis not present

## 2016-12-03 DIAGNOSIS — R748 Abnormal levels of other serum enzymes: Secondary | ICD-10-CM | POA: Diagnosis not present

## 2016-12-03 DIAGNOSIS — Z23 Encounter for immunization: Secondary | ICD-10-CM

## 2016-12-03 DIAGNOSIS — E7849 Other hyperlipidemia: Secondary | ICD-10-CM

## 2016-12-03 DIAGNOSIS — Z6830 Body mass index (BMI) 30.0-30.9, adult: Secondary | ICD-10-CM | POA: Diagnosis not present

## 2016-12-03 DIAGNOSIS — E784 Other hyperlipidemia: Secondary | ICD-10-CM | POA: Diagnosis not present

## 2016-12-03 NOTE — Progress Notes (Signed)
   Subjective:    Patient ID: Lisa Robertson, female    DOB: 09-08-1955, 61 y.o.   MRN: [id number]  HPI 61 year old 61 female in for six-month recheck on insulin-dependent diabetes mellitus. Her hemoglobin A1c is excellent at 6.3% and stable.  She will get flu vaccine through pharmacy.  Restart take Prevnar 13 today.  Feels well.  No new complaints.  Has very mild elevation of liver functions. In September 2017 SGOT was 32 and previously had been 40 in March 2017. SGPT was 34 and previously had been 39 in March 2017. Currently SGOT is 37 and SGOT today is 44. In March 2018 SGOT was 31 and SGPT was 34. We will continue to monitor disease.  Remains on losartan, HCTZ 12.5 mg twice daily. Cardizem CD and atenolol for hypertension. In addition to Levemir and NovoLog she is on metformin. She continues to watch her diet.  She is on Zocor 20 mg daily.  Weight is stable at 167 pounds and was 168 pounds in March 2018    Review of Systems  Constitutional: Negative.   Respiratory: Negative.   Cardiovascular: Negative.   Gastrointestinal:       Occasional diarrhea with metformin. May take Imodium.  Genitourinary: Negative.        Objective:   Physical Exam Skin warm and dry. Nodes none. Neck is supple without JVD thyromegaly or carotid bruits. Chest clear to auscultation. Cardiac exam regular rate and rhythm normal S1 and S2 without murmur or gallop. Extremities without edema.       Assessment & Plan:  Insulin-dependent diabetes mellitus with excellent control with hemoglobin A1c 6.3%  Mild elevation of liver functions on statin medication-continue to monitor  [place] 13 today. She will get flu vaccine through pharmacy.  Diarrhea with metformin on occasion. May take Imodium right ear if needed.  Plan: Return in 6 months for physical examination or as needed.

## 2016-12-03 NOTE — Patient Instructions (Signed)
It was a pleasure to see you today. Prevnar 13 given. Have flu vaccine at pharmacy. Continue same medications and return in 6 months for physical examination.

## 2016-12-09 MED FILL — SIMVASTATIN 20 MG TABLET: 20 | 90 days supply | Qty: 90 | Fill #4

## 2016-12-09 MED FILL — CARTIA XT 300 MG CAPSULE SA: 300 | 90 days supply | Qty: 90 | Fill #4

## 2016-12-09 MED FILL — LOSARTAN POTASSIUM 100 MG T: 100 | 90 days supply | Qty: 90 | Fill #4

## 2016-12-09 MED FILL — HYDROCHLOROTHIAZIDE 12.5 MG: 12.5 | 90 days supply | Qty: 180 | Fill #4

## 2016-12-21 ENCOUNTER — Encounter: Payer: Self-pay | Admitting: Internal Medicine

## 2016-12-21 ENCOUNTER — Ambulatory Visit (INDEPENDENT_AMBULATORY_CARE_PROVIDER_SITE_OTHER): Payer: BC Managed Care – PPO | Admitting: Internal Medicine

## 2016-12-21 VITALS — BP 130/80 | HR 78 | Temp 98.4°F | Wt 166.0 lb

## 2016-12-21 DIAGNOSIS — M2550 Pain in unspecified joint: Secondary | ICD-10-CM

## 2016-12-21 DIAGNOSIS — M20091 Other deformity of right finger(s): Secondary | ICD-10-CM | POA: Diagnosis not present

## 2016-12-21 DIAGNOSIS — M20092 Other deformity of left finger(s): Secondary | ICD-10-CM

## 2016-12-21 DIAGNOSIS — Z23 Encounter for immunization: Secondary | ICD-10-CM

## 2016-12-21 MED ORDER — MELOXICAM 15 MG PO TABS: 15.0000 mg | ORAL_TABLET | Freq: Every day | ORAL | 2 refills | Status: DC

## 2016-12-21 MED FILL — MELOXICAM 15 MG TABLET: 15 | 30 days supply | Qty: 30 | Fill #0

## 2016-12-23 NOTE — Progress Notes (Signed)
   Subjective:    Patient ID: Lisa Robertson, female    DOB: Jul 25, 1955, 61 y.o.   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 Is going on a trip to [place] in the near future and wants to feel better.  Complains of hand stiffness, aching in knees and shoulders.    Review of Systems see above     Objective:   Physical Exam She has ulnar deviation both fifth fingers. She has contraction fourth finger first PIP joint. The joints are not hot or significantly swollen. She has some crepitus in her knees but no significant effusions. Good range of motion with knees. Ankles are not hot or red. Hands are not hot or red. Shoulders are slightly sore but she has good range of motion in her shoulders.       Assessment & Plan:  Diffuse arthralgias-this is new to me. I think we should investigate rheumatology cause for these multiple joint pains. We will start her on meloxicam 15 mg daily. We have drawn a number of rheumatology studies and will advise further when the results are received.  Addendum: She has a positive CCP greater than 250, sedimentation rate of 60, rheumatoid factor of 872. This would point to rheumatoid arthritis. Appointment will be made with rheumatologist.

## 2016-12-23 NOTE — Patient Instructions (Signed)
Appointment to be made with rheumatologist regarding abnormal rheumatology studies. Patient has been placed on meloxicam. Flu vaccine given today.

## 2016-12-24 ENCOUNTER — Telehealth: Payer: Self-pay

## 2016-12-24 ENCOUNTER — Other Ambulatory Visit: Payer: Self-pay | Admitting: Internal Medicine

## 2016-12-24 LAB — ANA, IFA COMPREHENSIVE PANEL
Anti Nuclear Antibody(ANA): POSITIVE — AB
ENA SM Ab Ser-aCnc: <1 AI
SM/RNP: <1 AI
SSA (RO) (ENA) ANTIBODY, IGG: NEGATIVE AI
SSB (La) (ENA) Antibody, IgG: 1.7 AI — AB
Scleroderma (Scl-70) (ENA) Antibody, IgG: <1 AI
ds DNA Ab: <1 IU/mL

## 2016-12-24 LAB — RHEUMATOID FACTOR: RHEUMATOID FACTOR: 872 [IU]/mL — AB (ref <14)

## 2016-12-24 LAB — ANTI-NUCLEAR AB-TITER (ANA TITER): ANA Titer 1: 1:160 {titer} — ABNORMAL HIGH

## 2016-12-24 LAB — CK TOTAL AND CKMB (NOT AT ARMC)

## 2016-12-24 LAB — CYCLIC CITRUL PEPTIDE ANTIBODY, IGG

## 2016-12-24 LAB — SEDIMENTATION RATE: SED RATE: 60 mm/h — AB (ref 0–30)

## 2016-12-24 MED FILL — BD PEN NDL NANO 32GX5/32": 32G X 4 MM | 25 days supply | Qty: 100 | Fill #6

## 2016-12-24 MED FILL — ONE TOUCH ULTRA TEST STRIPS: 50 days supply | Qty: 200 | Fill #0

## 2016-12-24 MED FILL — ATENOLOL 50 MG TABLET: 50 | 90 days supply | Qty: 90 | Fill #0

## 2016-12-24 MED FILL — BD PEN NDL NANO 32GX5/32: 32G X 4 MM | 25 days supply | Qty: 100 | Fill #6

## 2016-12-24 MED FILL — metFORMIN HCL 500 MG TABS: 500 | 30 days supply | Qty: 60 | Fill #5

## 2016-12-24 MED FILL — LEVEMIR FLEXTOUCH 100 UNITS: 100 | 30 days supply | Qty: 15 | Fill #8

## 2016-12-24 NOTE — Telephone Encounter (Signed)
Two labs were not done for the patient because it was supposed to be a frozen specimen and Joni was unaware. Do you want the patient to come back for a redraw of CK total and CKMB?

## 2016-12-24 NOTE — Telephone Encounter (Signed)
No. We have our diagnosis. She needs Rheumatology consult with [facility]. Records are ready to be sent.

## 2016-12-25 NOTE — Telephone Encounter (Signed)
Records have  Been sent already.

## 2017-01-01 ENCOUNTER — Telehealth: Payer: Self-pay

## 2017-01-01 DIAGNOSIS — Z794 Long term (current) use of insulin: Principal | ICD-10-CM

## 2017-01-01 DIAGNOSIS — IMO0001 Reserved for inherently not codable concepts without codable children: Secondary | ICD-10-CM

## 2017-01-01 DIAGNOSIS — E119 Type 2 diabetes mellitus without complications: Principal | ICD-10-CM

## 2017-01-01 NOTE — Telephone Encounter (Signed)
Pt called and because of her insurance she needs a referral to see the Nutrition department of [place]. Referral placed

## 2017-01-07 MED FILL — NOVOLOG FLEXPEN SYRINGE: 100 | 42 days supply | Qty: 15 | Fill #8

## 2017-01-11 ENCOUNTER — Telehealth: Payer: Self-pay | Admitting: Internal Medicine

## 2017-01-11 NOTE — Telephone Encounter (Signed)
Caller Name: Rhesa Forsberg   Relationship to Patient: self     Best Number: [phone]  Reason for call: pt was referred to Dr. Trudie Reed and states that her liver labs were elevated from Dr. Trudie Reed office.  Specialist wants to repeat labs in 1 month on 02/08/17 and patient says she wants Dr. Renold Genta to monitor her elevated liver function, not Dr. Trudie Reed.  Pt is having recent labs faxed to PCP.   Please advise.

## 2017-01-11 NOTE — Telephone Encounter (Signed)
Dr. Trudie Reed communicates with me via fax. I will follow with her.

## 2017-01-11 NOTE — Telephone Encounter (Signed)
Pt is aware.  

## 2017-01-21 MED FILL — BD PEN NDL NANO 32GX5/32": 32G X 4 MM | 25 days supply | Qty: 100 | Fill #7

## 2017-01-21 MED FILL — BD PEN NDL NANO 32GX5/32: 32G X 4 MM | 25 days supply | Qty: 100 | Fill #7

## 2017-01-21 MED FILL — MELOXICAM 15 MG TABLET: 15 | 30 days supply | Qty: 30 | Fill #1

## 2017-01-26 MED FILL — metFORMIN HCL 500 MG TABS: 500 | 30 days supply | Qty: 60 | Fill #6

## 2017-01-26 MED FILL — LEVEMIR FLEXTOUCH 100 UNITS: 100 | 30 days supply | Qty: 15 | Fill #9

## 2017-02-17 MED FILL — BD PEN NDL NANO 32GX5/32: 32G X 4 MM | 25 days supply | Qty: 100 | Fill #8

## 2017-02-17 MED FILL — BD PEN NDL NANO 32GX5/32": 32G X 4 MM | 25 days supply | Qty: 100 | Fill #8

## 2017-02-17 MED FILL — ONE TOUCH ULTRA TEST STRIPS: 50 days supply | Qty: 200 | Fill #1

## 2017-02-24 ENCOUNTER — Other Ambulatory Visit: Payer: Self-pay | Admitting: Internal Medicine

## 2017-02-24 MED FILL — NOVOLOG FLEXPEN SYRINGE: 100 | 42 days supply | Qty: 15 | Fill #0

## 2017-02-24 MED FILL — LEVEMIR FLEXTOUCH 100 UNITS: 100 | 30 days supply | Qty: 15 | Fill #10

## 2017-02-24 MED FILL — metFORMIN HCL 500 MG TABS: 500 | 30 days supply | Qty: 60 | Fill #7

## 2017-02-24 MED FILL — MELOXICAM 15 MG TABLET: 15 | 30 days supply | Qty: 30 | Fill #2

## 2017-02-25 MED FILL — SIMVASTATIN 20 MG TABLET: 20 | 90 days supply | Qty: 90 | Fill #0

## 2017-02-25 MED FILL — HYDROCHLOROTHIAZIDE 12.5 MG: 12.5 | 90 days supply | Qty: 180 | Fill #0

## 2017-02-25 MED FILL — CARTIA XT 300 MG CP24: 300 | 90 days supply | Qty: 90 | Fill #0

## 2017-02-25 MED FILL — LOSARTAN POTASSIUM 100 MG T: 100 | 90 days supply | Qty: 90 | Fill #0

## 2017-03-17 MED FILL — BD PEN NDL NANO 32GX5/32: 32G X 4 MM | 25 days supply | Qty: 100 | Fill #9

## 2017-03-17 MED FILL — BD PEN NDL NANO 32GX5/32": 32G X 4 MM | 25 days supply | Qty: 100 | Fill #9

## 2017-03-17 MED FILL — traMADol HCL 50 MG TABS: 50 | 7 days supply | Qty: 28 | Fill #0

## 2017-03-26 MED FILL — metFORMIN HCL 500 MG TABS: 500 | 30 days supply | Qty: 60 | Fill #8

## 2017-03-26 MED FILL — LEVEMIR FLEXTOUCH 100 UNITS: 100 | 30 days supply | Qty: 15 | Fill #11

## 2017-03-26 MED FILL — MELOXICAM 15 MG TABLET: 15 | 90 days supply | Qty: 90 | Fill #0

## 2017-03-26 MED FILL — ATENOLOL 50 MG TABLET: 50 | 90 days supply | Qty: 90 | Fill #1

## 2017-04-08 MED FILL — ONE TOUCH ULTRA TEST STRIPS: 50 days supply | Qty: 200 | Fill #2

## 2017-04-08 MED FILL — NOVOLOG FLEXPEN SYRINGE: 100 | 42 days supply | Qty: 15 | Fill #1

## 2017-04-08 MED FILL — BD PEN NDL NANO 32GX5/32": 32G X 4 MM | 25 days supply | Qty: 100 | Fill #10

## 2017-04-08 MED FILL — BD PEN NDL NANO 32GX5/32: 32G X 4 MM | 25 days supply | Qty: 100 | Fill #10

## 2017-04-16 MED FILL — traMADol HCL 50 MG TABS: 50 | 10 days supply | Qty: 40 | Fill #0

## 2017-04-23 ENCOUNTER — Other Ambulatory Visit: Payer: Self-pay | Admitting: Internal Medicine

## 2017-04-23 MED FILL — metFORMIN HCL 500 MG TABS: 500 | 30 days supply | Qty: 60 | Fill #9

## 2017-04-23 MED FILL — LEVEMIR FLEXTOUCH 100 UNITS: 100 | 30 days supply | Qty: 15 | Fill #0

## 2017-04-23 MED FILL — NAPROXEN 500 MG TABLET: 500 | 30 days supply | Qty: 60 | Fill #0

## 2017-05-04 MED FILL — BD PEN NDL NANO 32GX5/32": 32G X 4 MM | 25 days supply | Qty: 100 | Fill #11

## 2017-05-04 MED FILL — BD PEN NDL NANO 32GX5/32: 32G X 4 MM | 25 days supply | Qty: 100 | Fill #11

## 2017-05-18 ENCOUNTER — Other Ambulatory Visit: Payer: Self-pay | Admitting: Internal Medicine

## 2017-05-18 ENCOUNTER — Other Ambulatory Visit: Payer: BC Managed Care – PPO | Admitting: Internal Medicine

## 2017-05-18 DIAGNOSIS — Z13220 Encounter for screening for lipoid disorders: Secondary | ICD-10-CM

## 2017-05-18 DIAGNOSIS — Z794 Long term (current) use of insulin: Secondary | ICD-10-CM

## 2017-05-18 DIAGNOSIS — E119 Type 2 diabetes mellitus without complications: Secondary | ICD-10-CM

## 2017-05-18 DIAGNOSIS — Z0000 Encounter for general adult medical examination without abnormal findings: Secondary | ICD-10-CM

## 2017-05-18 DIAGNOSIS — Z1321 Encounter for screening for nutritional disorder: Secondary | ICD-10-CM

## 2017-05-18 DIAGNOSIS — IMO0001 Reserved for inherently not codable concepts without codable children: Secondary | ICD-10-CM

## 2017-05-18 DIAGNOSIS — I10 Essential (primary) hypertension: Secondary | ICD-10-CM

## 2017-05-18 DIAGNOSIS — Z01818 Encounter for other preprocedural examination: Secondary | ICD-10-CM

## 2017-05-18 DIAGNOSIS — Z1329 Encounter for screening for other suspected endocrine disorder: Secondary | ICD-10-CM

## 2017-05-18 NOTE — Addendum Note (Signed)
Addended by: Mady Haagensen on: 05/18/2017 09:22 AM   Modules accepted: Orders

## 2017-05-19 ENCOUNTER — Other Ambulatory Visit: Payer: Self-pay | Admitting: Internal Medicine

## 2017-05-19 DIAGNOSIS — IMO0001 Reserved for inherently not codable concepts without codable children: Secondary | ICD-10-CM

## 2017-05-19 DIAGNOSIS — Z794 Long term (current) use of insulin: Principal | ICD-10-CM

## 2017-05-19 DIAGNOSIS — E119 Type 2 diabetes mellitus without complications: Principal | ICD-10-CM

## 2017-05-19 LAB — COMPLETE METABOLIC PANEL WITH GFR
AG Ratio: 1.2 (calc) (ref 1.0–2.5)
ALBUMIN MSPROF: 4.1 g/dL (ref 3.6–5.1)
ALKALINE PHOSPHATASE (APISO): 59 U/L (ref 33–130)
ALT: 24 U/L (ref 6–29)
AST: 24 U/L (ref 10–35)
BUN: 19 mg/dL (ref 7–25)
CALCIUM: 10.1 mg/dL (ref 8.6–10.4)
CO2: 28 mmol/L (ref 20–32)
CREATININE: 0.7 mg/dL (ref 0.50–0.99)
Chloride: 100 mmol/L (ref 98–110)
GFR, EST AFRICAN AMERICAN: 107 mL/min/{1.73_m2} (ref >=60)
GFR, EST NON AFRICAN AMERICAN: 92 mL/min/{1.73_m2} (ref >=60)
GLOBULIN: 3.4 g/dL (ref 1.9–3.7)
GLUCOSE: 132 mg/dL — AB (ref 65–99)
Potassium: 4.1 mmol/L (ref 3.5–5.3)
SODIUM: 138 mmol/L (ref 135–146)
TOTAL PROTEIN: 7.5 g/dL (ref 6.1–8.1)
Total Bilirubin: 0.4 mg/dL (ref 0.2–1.2)

## 2017-05-19 LAB — TSH: TSH: 1.05 m[IU]/L (ref 0.40–4.50)

## 2017-05-19 LAB — CBC WITH DIFFERENTIAL/PLATELET
BASOS ABS: 21 {cells}/uL (ref 0–200)
BASOS PCT: 0.3 %
EOS PCT: 2.6 %
Eosinophils Absolute: 179 cells/uL (ref 15–500)
HEMATOCRIT: 39.3 % (ref 35.0–45.0)
Hemoglobin: 13 g/dL (ref 11.7–15.5)
LYMPHS ABS: 2795 {cells}/uL (ref 850–3900)
MCH: 28.7 pg (ref 27.0–33.0)
MCHC: 33.1 g/dL (ref 32.0–36.0)
MCV: 86.8 fL (ref 80.0–100.0)
MONOS PCT: 7.9 %
MPV: 10.2 fL (ref 7.5–12.5)
NEUTROS ABS: 3360 {cells}/uL (ref 1500–7800)
Neutrophils Relative %: 48.7 %
Platelets: 284 10*3/uL (ref 140–400)
RBC: 4.53 10*6/uL (ref 3.80–5.10)
RDW: 13.3 % (ref 11.0–15.0)
Total Lymphocyte: 40.5 %
WBC mixed population: 545 cells/uL (ref 200–950)
WBC: 6.9 10*3/uL (ref 3.8–10.8)

## 2017-05-19 LAB — LIPID PANEL
CHOL/HDL RATIO: 3.5 (calc) (ref <5.0)
CHOLESTEROL: 165 mg/dL (ref <200)
HDL: 47 mg/dL — ABNORMAL LOW (ref >50)
LDL CHOLESTEROL (CALC): 94 mg/dL
NON-HDL CHOLESTEROL (CALC): 118 mg/dL (ref <130)
Triglycerides: 141 mg/dL (ref <150)

## 2017-05-19 LAB — HEMOGLOBIN A1C
HEMOGLOBIN A1C: 6.5 %{Hb} — AB (ref <5.7)
MEAN PLASMA GLUCOSE: 140 (calc)
eAG (mmol/L): 7.7 (calc)

## 2017-05-19 LAB — MICROALBUMIN / CREATININE URINE RATIO
Creatinine, Urine: 102 mg/dL (ref 20–275)
MICROALB/CREAT RATIO: 10 ug/mg{creat} (ref <30)
Microalb, Ur: 1 mg/dL

## 2017-05-19 LAB — VITAMIN D 25 HYDROXY (VIT D DEFICIENCY, FRACTURES): VIT D 25 HYDROXY: 50 ng/mL (ref 30–100)

## 2017-05-19 LAB — PROTIME-INR
INR: 1
PROTHROMBIN TIME: 10.2 s (ref 9.0–11.5)

## 2017-05-19 MED FILL — NAPROXEN 500 MG TABLET: 500 | 30 days supply | Qty: 60 | Fill #1

## 2017-05-19 MED FILL — metFORMIN HCL 500 MG TABS: 500 | 30 days supply | Qty: 60 | Fill #10

## 2017-05-19 MED FILL — LEVEMIR FLEXTOUCH 100 UNITS: 100 | 30 days supply | Qty: 15 | Fill #1

## 2017-05-19 MED FILL — NOVOLOG FLEXPEN SYRINGE: 100 | 42 days supply | Qty: 15 | Fill #2

## 2017-05-20 MED FILL — ONE TOUCH ULTRA TEST STRIPS: 50 days supply | Qty: 200 | Fill #0

## 2017-05-31 ENCOUNTER — Ambulatory Visit
Admission: RE | Admit: 2017-05-31 | Discharge: 2017-05-31 | Disposition: A | Discharge status: Home / Self Care | Payer: BC Managed Care – PPO | Source: Ambulatory Visit | Attending: Internal Medicine | Admitting: Internal Medicine

## 2017-05-31 ENCOUNTER — Encounter: Payer: Self-pay | Admitting: Internal Medicine

## 2017-05-31 ENCOUNTER — Ambulatory Visit (INDEPENDENT_AMBULATORY_CARE_PROVIDER_SITE_OTHER): Payer: BC Managed Care – PPO | Admitting: Internal Medicine

## 2017-05-31 ENCOUNTER — Other Ambulatory Visit: Payer: Self-pay | Admitting: Internal Medicine

## 2017-05-31 VITALS — BP 160/90 | HR 100 | Temp 98.5°F | Ht 62.25 in | Wt 160.0 lb

## 2017-05-31 DIAGNOSIS — M79604 Pain in right leg: Secondary | ICD-10-CM | POA: Diagnosis not present

## 2017-05-31 DIAGNOSIS — E119 Type 2 diabetes mellitus without complications: Secondary | ICD-10-CM | POA: Diagnosis not present

## 2017-05-31 DIAGNOSIS — M1712 Unilateral primary osteoarthritis, left knee: Secondary | ICD-10-CM | POA: Diagnosis not present

## 2017-05-31 DIAGNOSIS — Z794 Long term (current) use of insulin: Secondary | ICD-10-CM | POA: Diagnosis not present

## 2017-05-31 MED ORDER — HYDROCODONE-ACETAMINOPHEN 10-325 MG PO TABS: 1.0000 | ORAL_TABLET | ORAL | 0 refills | Status: DC | PRN

## 2017-05-31 MED FILL — CARTIA XT 300 MG CAPSULE SA: 300 | 90 days supply | Qty: 90 | Fill #1

## 2017-05-31 MED FILL — SIMVASTATIN 20 MG TABLET: 20 | 90 days supply | Qty: 90 | Fill #1

## 2017-05-31 MED FILL — HYDROCHLOROTHIAZIDE 12.5 MG: 12.5 | 90 days supply | Qty: 180 | Fill #1

## 2017-05-31 MED FILL — BD PEN NDL NANO 32GX5/32: 32G X 4 MM | 25 days supply | Qty: 100 | Fill #12

## 2017-05-31 MED FILL — BD PEN NDL NANO 32GX5/32": 32G X 4 MM | 25 days supply | Qty: 100 | Fill #12

## 2017-05-31 MED FILL — HYDROCODON-APAP 10-325: 10-325 | 5 days supply | Qty: 30 | Fill #0

## 2017-05-31 MED FILL — LOSARTAN POTASSIUM 100 MG T: 100 | 90 days supply | Qty: 90 | Fill #1

## 2017-06-01 ENCOUNTER — Ambulatory Visit (HOSPITAL_COMMUNITY): Payer: BC Managed Care – PPO

## 2017-06-01 ENCOUNTER — Other Ambulatory Visit: Payer: BC Managed Care – PPO | Admitting: Internal Medicine

## 2017-06-01 NOTE — Patient Instructions (Signed)
She will take hydrocodone APAP instead of tramadol every 8 hours as needed for pain.  Continue Naprosyn.  The vascular ultrasound of the right lower extremity is negative

## 2017-06-01 NOTE — Progress Notes (Signed)
   Subjective:    Patient ID: Lisa Robertson, female    DOB: 24-Oct-1955, 62 y.o.   MRN: [id number]  HPI Patient brought in by her husband today with severe pain in her right leg.  Patient noticed that superficial veins were prominent in her right leg.  She is scheduled to have left knee replacement by Dr. Latanya Maudlin in the near future.  She has tramadol on hand that he gave her for pain but she did not take it because she was taking Naprosyn and she felt the 2 were related.  Explained to her that these 2 were not the same category of medication.    I think she has probably been putting most of her weight on her right leg because her left knee is painful.    Review of Systems     Objective:   Physical Exam She has tenderness in her right thigh and also her right calf.  Homans sign is negative.  There is no knee swelling.  She is ambulating with a cane.  There is no lower extremity edema.  We did order a vascular ultrasound today and there is no evidence of DVT right lower extremity musculoskeletal pain  Plan: Prescription for     Assessment & Plan:  Hydrocodone APAP 10/325 1 p.o. every 8 hours as needed pain.  Continue Naprosyn.  Hold tramadol for now.

## 2017-06-04 ENCOUNTER — Ambulatory Visit (INDEPENDENT_AMBULATORY_CARE_PROVIDER_SITE_OTHER): Payer: BC Managed Care – PPO | Admitting: Internal Medicine

## 2017-06-04 ENCOUNTER — Encounter: Payer: Self-pay | Admitting: Internal Medicine

## 2017-06-04 VITALS — BP 138/80 | HR 73 | Ht 62.0 in | Wt 163.0 lb

## 2017-06-04 DIAGNOSIS — M1712 Unilateral primary osteoarthritis, left knee: Secondary | ICD-10-CM | POA: Diagnosis not present

## 2017-06-04 DIAGNOSIS — Z6829 Body mass index (BMI) 29.0-29.9, adult: Secondary | ICD-10-CM

## 2017-06-04 DIAGNOSIS — Z0000 Encounter for general adult medical examination without abnormal findings: Secondary | ICD-10-CM | POA: Diagnosis not present

## 2017-06-04 DIAGNOSIS — Z01818 Encounter for other preprocedural examination: Secondary | ICD-10-CM

## 2017-06-04 DIAGNOSIS — E7849 Other hyperlipidemia: Secondary | ICD-10-CM

## 2017-06-04 DIAGNOSIS — I10 Essential (primary) hypertension: Secondary | ICD-10-CM | POA: Diagnosis not present

## 2017-06-04 DIAGNOSIS — E8881 Metabolic syndrome: Secondary | ICD-10-CM

## 2017-06-04 DIAGNOSIS — Z23 Encounter for immunization: Secondary | ICD-10-CM

## 2017-06-04 DIAGNOSIS — Z419 Encounter for procedure for purposes other than remedying health state, unspecified: Secondary | ICD-10-CM

## 2017-06-04 DIAGNOSIS — Z794 Long term (current) use of insulin: Secondary | ICD-10-CM

## 2017-06-04 DIAGNOSIS — E119 Type 2 diabetes mellitus without complications: Secondary | ICD-10-CM

## 2017-06-04 LAB — POCT URINALYSIS DIPSTICK
Appearance: NORMAL
Bilirubin, UA: NEGATIVE
Blood, UA: NEGATIVE
Glucose, UA: NEGATIVE
Ketones, UA: NEGATIVE
LEUKOCYTES UA: NEGATIVE
NITRITE UA: NEGATIVE
Odor: NORMAL
PH UA: 6.5 (ref 5.0–8.0)
PROTEIN UA: NEGATIVE
SPEC GRAV UA: 1.015 (ref 1.010–1.025)
UROBILINOGEN UA: 0.2 U/dL

## 2017-06-04 NOTE — Patient Instructions (Signed)
It was a pleasure to see you today.  Good luck with your upcoming surgery.  Please have chest x-ray at [facility] imaging in anticipation of her surgery.  EKG is within normal limits.  Continue same medications and return in 6 months.

## 2017-06-04 NOTE — Progress Notes (Signed)
Subjective:    Patient ID: Lisa Robertson, female    DOB: 1955-04-03, 62 y.o.   MRN: [id number]  HPI 62 year old Female for health maintenance exam and evaluation of medical issues as well as pre-op clearance for left knee arthroplasty.  She was just here on March 18 with severe right leg pain secondary to overuse of inability to put weight on left leg.  She was having considerable pain and not sleeping well.  We gave her hydrocodone APAP and that seemed to help.  Told her to discontinue tramadol.  She is feeling much better today.  We did do a lower extremity ultrasound to rule out DVT which proved to be negative.  Surgery is scheduled for next week.  EKG today shows normal sinus rhythm.  Hemoglobin A1c is stable at 6.5%.  She is an insulin-dependent diabetic.  Vitamin D level is 50.  BUN and creatinine are normal.  Sodium and potassium are normal.  TSH is normal.  Pro time INR is normal.  Lipids are normal except for low HDL of 47.  She is compliant with her medications.  History of essential hypertension which is stable.  Blood pressure stable today at 138/80.  BMI is 29.81.  She has lost 5 pounds since her physical exam last year  History of bilateral tubal ligation.Marland Kitchen History of total abdominal hysterectomy without oophorectomy for fibroids by Dr. Nori Riis in July 04, 1996.  History of insulin-dependent diabetes mellitus.  History of hypertension since Jul 04, 1976.  She is on statin therapy.  History of obesity.  Has been to diabetic educator at the diabetes treatment Center.  Wears glasses.  Has annual diabetic eye exam.  Social history: She is married.  Has 2 sons and 1 daughter.  Does not smoke or consume alcohol.  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  Husband is an Archivist at [employer].  He retired and then returned back to work part-time.  Family history: Son with history of  kidney transplant from uncontrolled hypertension.  Father with history of hypertension, prostate cancer and diabetes along with heart disease.  Mother died of a stroke in 2016-07-04 with history of diabetes and hypertension.  3 sisters and no brothers.          Review of Systems  Constitutional: Negative.   Respiratory: Negative.   Cardiovascular: Negative.   Gastrointestinal: Negative.   Musculoskeletal:       Left knee pain  Neurological: Negative.   Psychiatric/Behavioral: Negative.        Objective:   Physical Exam  Constitutional: She is oriented to person, place, and time. She appears well-developed and well-nourished. No distress.  HENT:  Head: Normocephalic and atraumatic.  Right Ear: External ear normal.  Left Ear: External ear normal.  Mouth/Throat: Oropharynx is clear and moist.  Eyes: Pupils are equal, round, and reactive to light. Conjunctivae and EOM are normal. Right eye exhibits no discharge. Left eye exhibits no discharge. No scleral icterus.  Neck: Neck supple. No JVD present. No thyromegaly present.  Cardiovascular: Normal rate, regular rhythm, normal heart sounds and intact distal pulses.  No murmur heard. Pulmonary/Chest: Effort normal and breath sounds normal. No respiratory distress. She has no wheezes. She has no rales. She exhibits no tenderness.  Abdominal: Soft. Bowel sounds are normal. She exhibits no distension and no mass. There is no tenderness. There is no rebound and no  guarding.  Genitourinary:  Genitourinary Comments: Pap not done secondary to hysterectomy.  Bimanual normal.  Rectovaginal confirms.  Musculoskeletal: She exhibits no edema.  Lymphadenopathy:    She has no cervical adenopathy.  Neurological: She is alert and oriented to person, place, and time. She has normal reflexes. No cranial nerve deficit. Coordination normal.  Skin: Skin is warm and dry. No rash noted. She is not diaphoretic.  Psychiatric: She has a normal mood and affect.  Her behavior is normal. Judgment and thought content normal.  Vitals reviewed.         Assessment & Plan:  Insulin-dependent diabetes mellitus stable and under good control  Essential hypertension-stable and under good control  End-stage osteoarthritis left knee left knee arthroplasty next week preoperative exam is within normal limits.  She is cleared for surgery  History of hyperlipidemia-continue statin therapy.  Lipids are normal.  Has low HDL cholesterol.  Plan: Continue current medications and return in 6 months for follow-up.  Will have preoperative chest x-ray at [place].  Pneumovax 23 given today.

## 2017-06-04 NOTE — Progress Notes (Deleted)
cxr 

## 2017-06-07 MED FILL — tiZANidine HCL 4 MG TABS: 4 | 10 days supply | Qty: 40 | Fill #0

## 2017-06-08 MED FILL — oxyCODONE HCL 5 MG TABS: 5 | 3 days supply | Qty: 40 | Fill #0

## 2017-06-09 NOTE — Progress Notes (Signed)
Osteoarthritis of knee

## 2017-06-22 MED FILL — HYDROCODON-APAP 5-325: 5-325 | 10 days supply | Qty: 30 | Fill #0

## 2017-06-24 MED FILL — ATENOLOL 50 MG TABLET: 50 | 90 days supply | Qty: 90 | Fill #2

## 2017-06-24 MED FILL — LEVEMIR FLEXTOUCH 100 UNITS: 100 | 30 days supply | Qty: 15 | Fill #2

## 2017-06-24 MED FILL — BD PEN NDL NANO 32GX5/32": 32G X 4 MM | 25 days supply | Qty: 100 | Fill #13

## 2017-06-24 MED FILL — BD PEN NDL NANO 32GX5/32: 32G X 4 MM | 25 days supply | Qty: 100 | Fill #13

## 2017-07-05 MED FILL — NOVOLOG FLEXPEN SYRINGE: 100 | 42 days supply | Qty: 15 | Fill #3

## 2017-07-05 MED FILL — ONE TOUCH ULTRA TEST STRIPS: 50 days supply | Qty: 200 | Fill #1

## 2017-07-08 MED FILL — HYDROCODON-APAP 5-325: 5-325 | 10 days supply | Qty: 30 | Fill #0

## 2017-07-12 MED FILL — tiZANidine HCL 4 MG TABS: 4 | 10 days supply | Qty: 40 | Fill #0

## 2017-07-20 ENCOUNTER — Other Ambulatory Visit: Payer: Self-pay | Admitting: Internal Medicine

## 2017-07-20 MED FILL — BD PEN NDL NANO 32GX5/32": 32G X 4 MM | 25 days supply | Qty: 100 | Fill #0

## 2017-07-20 MED FILL — BD PEN NDL NANO 32GX5/32: 32G X 4 MM | 25 days supply | Qty: 100 | Fill #0

## 2017-07-22 MED FILL — metFORMIN HCL 500 MG TABS: 500 | 30 days supply | Qty: 60 | Fill #11

## 2017-07-22 MED FILL — LEVEMIR FLEXTOUCH 100 UNITS: 100 | 30 days supply | Qty: 15 | Fill #3

## 2017-08-10 ENCOUNTER — Other Ambulatory Visit: Payer: Self-pay

## 2017-08-10 DIAGNOSIS — Z794 Long term (current) use of insulin: Principal | ICD-10-CM

## 2017-08-10 DIAGNOSIS — IMO0001 Reserved for inherently not codable concepts without codable children: Secondary | ICD-10-CM

## 2017-08-10 DIAGNOSIS — E119 Type 2 diabetes mellitus without complications: Principal | ICD-10-CM

## 2017-08-10 MED ORDER — ONETOUCH ULTRA 2 W/DEVICE KIT: PACK | 0 refills | Status: DC

## 2017-08-10 MED FILL — BD PEN NDL NANO 32GX5/32: 32G X 4 MM | 25 days supply | Qty: 100 | Fill #1

## 2017-08-10 MED FILL — BD PEN NDL NANO 32GX5/32": 32G X 4 MM | 25 days supply | Qty: 100 | Fill #1

## 2017-08-11 MED FILL — ONE TOUCH ULTRA 2 GLUCOSE S: W/DEVICE | 30 days supply | Qty: 1 | Fill #0

## 2017-08-17 MED FILL — NOVOLOG FLEXPEN SYRINGE: 100 | 42 days supply | Qty: 15 | Fill #4

## 2017-08-17 MED FILL — ONE TOUCH ULTRA TEST STRIPS: 50 days supply | Qty: 200 | Fill #2

## 2017-08-17 MED FILL — LOSARTAN POTASSIUM 100 MG T: 100 | 90 days supply | Qty: 90 | Fill #2

## 2017-08-17 MED FILL — HYDROCHLOROTHIAZIDE 12.5 MG: 12.5 | 90 days supply | Qty: 180 | Fill #2

## 2017-08-17 MED FILL — tiZANidine HCL 4 MG TABS: 4 | 10 days supply | Qty: 40 | Fill #1

## 2017-08-17 MED FILL — LEVEMIR FLEXTOUCH 100 UNITS: 100 | 30 days supply | Qty: 15 | Fill #4

## 2017-08-17 MED FILL — CARTIA XT 300 MG CAPSULE SA: 300 | 90 days supply | Qty: 90 | Fill #2

## 2017-08-25 ENCOUNTER — Other Ambulatory Visit: Payer: Self-pay | Admitting: Internal Medicine

## 2017-08-25 MED FILL — metFORMIN HCL 500 MG TABS: 500 | 30 days supply | Qty: 60 | Fill #0

## 2017-09-01 MED FILL — BD PEN NDL NANO 32GX5/32: 32G X 4 MM | 25 days supply | Qty: 100 | Fill #2

## 2017-09-01 MED FILL — SIMVASTATIN 20 MG TABLET: 20 | 90 days supply | Qty: 90 | Fill #2

## 2017-09-01 MED FILL — BD PEN NDL NANO 32GX5/32": 32G X 4 MM | 25 days supply | Qty: 100 | Fill #2

## 2017-09-23 MED FILL — LEVEMIR FLEXTOUCH 100 UNITS: 100 | 30 days supply | Qty: 15 | Fill #5

## 2017-09-23 MED FILL — ATENOLOL 50 MG TABLET: 50 | 90 days supply | Qty: 90 | Fill #3

## 2017-09-23 MED FILL — NOVOLOG FLEXPEN SYRINGE: 100 | 42 days supply | Qty: 15 | Fill #5

## 2017-09-29 MED FILL — BD PEN NDL NANO 32GX5/32": 32G X 4 MM | 25 days supply | Qty: 100 | Fill #3

## 2017-09-29 MED FILL — BD PEN NDL NANO 32GX5/32: 32G X 4 MM | 25 days supply | Qty: 100 | Fill #3

## 2017-10-08 MED FILL — ONE TOUCH ULTRA TEST STRIPS: 50 days supply | Qty: 200 | Fill #3

## 2017-10-18 MED FILL — PENICILLIN VK 500 MG TABLET: 500 | 5 days supply | Qty: 20 | Fill #0

## 2017-10-21 MED FILL — BD PEN NDL NANO 32GX5/32: 32G X 4 MM | 25 days supply | Qty: 100 | Fill #4

## 2017-10-21 MED FILL — metFORMIN HCL 500 MG TABS: 500 | 30 days supply | Qty: 60 | Fill #1

## 2017-10-21 MED FILL — LEVEMIR FLEXTOUCH 100 UNITS: 100 | 30 days supply | Qty: 15 | Fill #6

## 2017-10-21 MED FILL — BD PEN NDL NANO 32GX5/32": 32G X 4 MM | 25 days supply | Qty: 100 | Fill #4

## 2017-11-12 MED FILL — MELOXICAM 15 MG TABLET: 15 | 90 days supply | Qty: 90 | Fill #1

## 2017-11-12 MED FILL — NOVOLOG FLEXPEN SYRINGE: 100 | 42 days supply | Qty: 15 | Fill #6

## 2017-11-18 MED FILL — BD PEN NDL NANO 32GX5/32: 32G X 4 MM | 25 days supply | Qty: 100 | Fill #5

## 2017-11-18 MED FILL — LEVEMIR FLEXTOUCH 100 UNITS: 100 | 30 days supply | Qty: 15 | Fill #7

## 2017-11-18 MED FILL — ONE TOUCH ULTRA TEST STRIPS: 50 days supply | Qty: 200 | Fill #4

## 2017-11-18 MED FILL — BD PEN NDL NANO 32GX5/32": 32G X 4 MM | 25 days supply | Qty: 100 | Fill #5

## 2017-11-24 MED FILL — HYDROCHLOROTHIAZIDE 12.5 MG: 12.5 | 90 days supply | Qty: 180 | Fill #3

## 2017-11-24 MED FILL — CARTIA XT 300 MG CAPSULE SA: 300 | 90 days supply | Qty: 90 | Fill #3

## 2017-11-24 MED FILL — LOSARTAN POTASSIUM 100 MG T: 100 | 90 days supply | Qty: 90 | Fill #3

## 2017-11-24 MED FILL — metFORMIN HCL 500 MG TABS: 500 | 30 days supply | Qty: 60 | Fill #2

## 2017-11-24 MED FILL — SIMVASTATIN 20 MG TABLET: 20 | 90 days supply | Qty: 90 | Fill #3

## 2017-12-03 ENCOUNTER — Other Ambulatory Visit: Payer: Self-pay | Admitting: Internal Medicine

## 2017-12-03 DIAGNOSIS — I10 Essential (primary) hypertension: Secondary | ICD-10-CM

## 2017-12-03 DIAGNOSIS — Z794 Long term (current) use of insulin: Secondary | ICD-10-CM

## 2017-12-03 DIAGNOSIS — Z79899 Other long term (current) drug therapy: Secondary | ICD-10-CM

## 2017-12-03 DIAGNOSIS — E119 Type 2 diabetes mellitus without complications: Secondary | ICD-10-CM

## 2017-12-03 DIAGNOSIS — Z5181 Encounter for therapeutic drug level monitoring: Secondary | ICD-10-CM

## 2017-12-03 DIAGNOSIS — IMO0001 Reserved for inherently not codable concepts without codable children: Secondary | ICD-10-CM

## 2017-12-07 ENCOUNTER — Other Ambulatory Visit: Payer: BC Managed Care – PPO | Admitting: Internal Medicine

## 2017-12-07 DIAGNOSIS — Z794 Long term (current) use of insulin: Secondary | ICD-10-CM

## 2017-12-07 DIAGNOSIS — E119 Type 2 diabetes mellitus without complications: Secondary | ICD-10-CM

## 2017-12-07 DIAGNOSIS — Z5181 Encounter for therapeutic drug level monitoring: Secondary | ICD-10-CM

## 2017-12-07 DIAGNOSIS — Z79899 Other long term (current) drug therapy: Secondary | ICD-10-CM

## 2017-12-07 DIAGNOSIS — IMO0001 Reserved for inherently not codable concepts without codable children: Secondary | ICD-10-CM

## 2017-12-07 DIAGNOSIS — I10 Essential (primary) hypertension: Secondary | ICD-10-CM

## 2017-12-08 ENCOUNTER — Other Ambulatory Visit: Payer: Self-pay | Admitting: Internal Medicine

## 2017-12-08 DIAGNOSIS — Z1231 Encounter for screening mammogram for malignant neoplasm of breast: Secondary | ICD-10-CM

## 2017-12-08 LAB — HEPATIC FUNCTION PANEL
AG RATIO: 1.4 (calc) (ref 1.0–2.5)
ALKALINE PHOSPHATASE (APISO): 51 U/L (ref 33–130)
ALT: 31 U/L — ABNORMAL HIGH (ref 6–29)
AST: 32 U/L (ref 10–35)
Albumin: 4.5 g/dL (ref 3.6–5.1)
BILIRUBIN DIRECT: 0.1 mg/dL (ref 0.0–0.2)
BILIRUBIN INDIRECT: 0.2 mg/dL (ref 0.2–1.2)
BILIRUBIN TOTAL: 0.3 mg/dL (ref 0.2–1.2)
GLOBULIN: 3.3 g/dL (ref 1.9–3.7)
Total Protein: 7.8 g/dL (ref 6.1–8.1)

## 2017-12-08 LAB — HEMOGLOBIN A1C
HEMOGLOBIN A1C: 6.7 %{Hb} — AB (ref <5.7)
Mean Plasma Glucose: 146 (calc)
eAG (mmol/L): 8.1 (calc)

## 2017-12-08 LAB — MICROALBUMIN / CREATININE URINE RATIO
Creatinine, Urine: 65 mg/dL (ref 20–275)
MICROALB UR: 0.5 mg/dL
MICROALB/CREAT RATIO: 8 ug/mg{creat} (ref <30)

## 2017-12-08 LAB — LIPID PANEL
CHOL/HDL RATIO: 3.6 (calc) (ref <5.0)
Cholesterol: 171 mg/dL (ref <200)
HDL: 47 mg/dL — ABNORMAL LOW (ref >50)
LDL Cholesterol (Calc): 99 mg/dL (calc)
NON-HDL CHOLESTEROL (CALC): 124 mg/dL (ref <130)
Triglycerides: 148 mg/dL (ref <150)

## 2017-12-09 ENCOUNTER — Encounter: Payer: Self-pay | Admitting: Internal Medicine

## 2017-12-09 ENCOUNTER — Ambulatory Visit: Payer: BC Managed Care – PPO | Admitting: Internal Medicine

## 2017-12-09 VITALS — BP 140/80 | HR 74 | Temp 98.3°F | Ht 62.0 in | Wt 166.0 lb

## 2017-12-09 DIAGNOSIS — Z6830 Body mass index (BMI) 30.0-30.9, adult: Secondary | ICD-10-CM

## 2017-12-09 DIAGNOSIS — E6609 Other obesity due to excess calories: Secondary | ICD-10-CM

## 2017-12-09 DIAGNOSIS — IMO0001 Reserved for inherently not codable concepts without codable children: Secondary | ICD-10-CM

## 2017-12-09 DIAGNOSIS — Z794 Long term (current) use of insulin: Secondary | ICD-10-CM

## 2017-12-09 DIAGNOSIS — E8881 Metabolic syndrome: Secondary | ICD-10-CM

## 2017-12-09 DIAGNOSIS — Z23 Encounter for immunization: Secondary | ICD-10-CM | POA: Diagnosis not present

## 2017-12-09 DIAGNOSIS — I10 Essential (primary) hypertension: Secondary | ICD-10-CM | POA: Diagnosis not present

## 2017-12-09 DIAGNOSIS — E119 Type 2 diabetes mellitus without complications: Secondary | ICD-10-CM

## 2017-12-09 NOTE — Patient Instructions (Signed)
Continue to work on diet exercise and weight loss.  Continue same medications.  Lab results discussed with her at length.

## 2017-12-09 NOTE — Progress Notes (Signed)
   Subjective:    Patient ID: Lisa Robertson, female    DOB: 12/06/1955, 62 y.o.   MRN: [id number]  HPI In for 6 month follow up of HTN, hyperlipidemia and Type 2 DM that is insulin dependednt.  Had knee replacement late March and is doing well.  Flu vaccine given today.  Hgb AIC 6.7% and was 6.5% 6 months ago.  She has a low HDL of 47.  Total cholesterol and triglycerides are normal and LDL cholesterol is normal at 99. SGOT is normal.  SGPT is 31.  Has had fluctuations in SGOT and SGPT in September 2018 when SGOT was 37 and SGPT was 44.  In March 2018 SGPT was 34.  These are likely insignificant as they are less than 100.  Remains on statin, losartan, HCTZ, Cardizem, atenolol.  Blood pressure medication will not be changed at this time.  We will continue to monitor.  Asked her to check this at home.     Review of Systems feels that knee is getting less stiff.     Objective:   Physical Exam  Constitutional: She appears well-developed and well-nourished.  HENT:  Head: Normocephalic and atraumatic.  Right Ear: External ear normal.  Left Ear: External ear normal.  Mouth/Throat: Oropharynx is clear and moist.  Eyes: Pupils are equal, round, and reactive to light. Conjunctivae and EOM are normal. Right eye exhibits no discharge. Left eye exhibits no discharge.  Neck: No JVD present. No thyromegaly present.  Cardiovascular: Normal rate, regular rhythm, normal heart sounds and intact distal pulses.  Pulmonary/Chest: Effort normal and breath sounds normal. No respiratory distress. She has no wheezes. She has no rales.  Abdominal: Soft. Bowel sounds are normal. She exhibits no distension and no mass. There is no tenderness. There is no guarding.  Psychiatric: She has a normal mood and affect. Her behavior is normal. Thought content normal.  Vitals reviewed.         Assessment & Plan:  Class I obesity due to excess calories with serious comorbidity and body mass index of 30-30.9  in adult.  She has insulin-dependent diabetes.  Needs to continue to work on diet exercise and weight loss.  Does not want to see dietitian.  Essential hypertension-stable.  Slightly elevated systolic at 572 today but continue to monitor at home.  Did not change medications.  Routine health maintenance-flu vaccine given today  Insulin-dependent diabetes-hemoglobin A1c 6.7% which is excellent for insulin-dependent diabetes.  Plan: She will return in 6 months for physical examination.  Continue to work on diet exercise and weight loss.  Continue same medications.  Lab results discussed with her at length.  Time spent with patient 25 minutes.

## 2017-12-17 MED FILL — LEVEMIR FLEXTOUCH 100 UNITS: 100 | 30 days supply | Qty: 15 | Fill #8

## 2017-12-17 MED FILL — BD PEN NDL NANO 32GX5/32: 32G X 4 MM | 25 days supply | Qty: 100 | Fill #6

## 2017-12-17 MED FILL — NOVOLOG FLEXPEN SYRINGE: 100 | 42 days supply | Qty: 15 | Fill #7

## 2017-12-17 MED FILL — metFORMIN HCL 500 MG TABS: 500 | 30 days supply | Qty: 60 | Fill #3

## 2017-12-17 MED FILL — BD PEN NDL NANO 32GX5/32": 32G X 4 MM | 25 days supply | Qty: 100 | Fill #6

## 2017-12-24 ENCOUNTER — Other Ambulatory Visit: Payer: Self-pay | Admitting: Internal Medicine

## 2017-12-24 MED FILL — ATENOLOL 50 MG TABLET: 50 | 90 days supply | Qty: 90 | Fill #0

## 2018-01-12 ENCOUNTER — Ambulatory Visit
Admission: RE | Admit: 2018-01-12 | Discharge: 2018-01-12 | Disposition: A | Discharge status: Home / Self Care | Payer: BC Managed Care – PPO | Source: Ambulatory Visit | Attending: Internal Medicine | Admitting: Internal Medicine

## 2018-01-12 DIAGNOSIS — Z1231 Encounter for screening mammogram for malignant neoplasm of breast: Secondary | ICD-10-CM

## 2018-01-12 MED FILL — BD PEN NDL NANO 32GX5/32: 32G X 4 MM | 25 days supply | Qty: 100 | Fill #7

## 2018-01-12 MED FILL — BD PEN NDL NANO 32GX5/32": 32G X 4 MM | 25 days supply | Qty: 100 | Fill #7

## 2018-01-17 ENCOUNTER — Ambulatory Visit (AMBULATORY_SURGERY_CENTER): Payer: Self-pay

## 2018-01-17 VITALS — Ht 63.5 in | Wt 170.0 lb

## 2018-01-17 DIAGNOSIS — Z86010 Personal history of colonic polyps: Secondary | ICD-10-CM

## 2018-01-17 NOTE — Progress Notes (Signed)
Denies allergies to eggs or soy products. Denies complication of anesthesia or sedation. Denies use of weight loss medication. Denies use of O2.   Emmi instructions declined.  

## 2018-01-18 ENCOUNTER — Encounter: Payer: Self-pay | Admitting: Internal Medicine

## 2018-01-20 MED FILL — LEVEMIR FLEXTOUCH 100 UNITS: 100 | 30 days supply | Qty: 15 | Fill #9

## 2018-01-24 MED FILL — ONE TOUCH ULTRA TEST STRIPS: 50 days supply | Qty: 200 | Fill #5

## 2018-01-31 ENCOUNTER — Encounter: Payer: Self-pay | Admitting: Internal Medicine

## 2018-01-31 ENCOUNTER — Ambulatory Visit (AMBULATORY_SURGERY_CENTER): Payer: BC Managed Care – PPO | Admitting: Internal Medicine

## 2018-01-31 VITALS — BP 124/84 | HR 69 | Temp 96.8°F | Resp 13 | Ht 63.5 in | Wt 170.0 lb

## 2018-01-31 DIAGNOSIS — D122 Benign neoplasm of ascending colon: Secondary | ICD-10-CM

## 2018-01-31 DIAGNOSIS — Z1211 Encounter for screening for malignant neoplasm of colon: Secondary | ICD-10-CM

## 2018-01-31 MED ORDER — SODIUM CHLORIDE 0.9 % IV SOLN: 500.0000 mL | Freq: Once | INTRAVENOUS | Status: DC

## 2018-01-31 NOTE — Progress Notes (Signed)
Pt's states no medical or surgical changes since previsit or office visit. 

## 2018-01-31 NOTE — Progress Notes (Signed)
Report given to PACU, vss 

## 2018-01-31 NOTE — Patient Instructions (Addendum)
I found and removed one tiny polyp from the colon.  No suggestion of cancer or anything bad.  You do have diverticulosis - thickened muscle rings and pouches in the colon wall. Please read the handout about this condition.  I will let you know pathology results and when to have another routine colonoscopy by mail and/or My Chart.  I appreciate the opportunity to care for you. Gatha Mayer, MD, FACG YOU HAD AN ENDOSCOPIC PROCEDURE TODAY AT [facility]:   Refer to the procedure report that was given to you for any specific questions about what was found during the examination.  If the procedure report does not answer your questions, please call your gastroenterologist to clarify.  If you requested that your care partner not be given the details of your procedure findings, then the procedure report has been included in a sealed envelope for you to review at your convenience later.  YOU SHOULD EXPECT: Some feelings of bloating in the abdomen. Passage of more gas than usual.  Walking can help get rid of the air that was put into your GI tract during the procedure and reduce the bloating. If you had a lower endoscopy (such as a colonoscopy or flexible sigmoidoscopy) you may notice spotting of blood in your stool or on the toilet paper. If you underwent a bowel prep for your procedure, you may not have a normal bowel movement for a few days.  Please Note:  You might notice some irritation and congestion in your nose or some drainage.  This is from the oxygen used during your procedure.  There is no need for concern and it should clear up in a day or so.  SYMPTOMS TO REPORT IMMEDIATELY:   Following lower endoscopy (colonoscopy or flexible sigmoidoscopy):  Excessive amounts of blood in the stool  Significant tenderness or worsening of abdominal pains  Swelling of the abdomen that is new, acute  Fever of 100F or higher   For urgent or emergent issues, a  gastroenterologist can be reached at any hour by calling [phone].   DIET:  We do recommend a small meal at first, but then you may proceed to your regular diet.  Drink plenty of fluids but you should avoid alcoholic beverages for 24 hours.  MEDICATIONS: Continue present medications.  Please see handouts given to you by your recovery nurse.  ACTIVITY:  You should plan to take it easy for the rest of today and you should NOT DRIVE or use heavy machinery until tomorrow (because of the sedation medicines used during the test).    FOLLOW UP: Our staff will call the number listed on your records the next business day following your procedure to check on you and address any questions or concerns that you may have regarding the information given to you following your procedure. If we do not reach you, we will leave a message.  However, if you are feeling well and you are not experiencing any problems, there is no need to return our call.  We will assume that you have returned to your regular daily activities without incident.  If any biopsies were taken you will be contacted by phone or by letter within the next 1-3 weeks.  Please call us at [phone] if you have not heard about the biopsies in 3 weeks.   Thank you for allowing [place] to provide for your healthcare needs today.   SIGNATURES/CONFIDENTIALITY: You and/or your care partner have signed paperwork  which will be entered into your electronic medical record.  These signatures attest to the fact that that the information above on your After Visit Summary has been reviewed and is understood.  Full responsibility of the confidentiality of this discharge information lies with you and/or your care-partner.

## 2018-01-31 NOTE — Op Note (Signed)
[facility] Patient Name: Lisa Robertson Procedure Date: 01/31/2018 10:36 AM MRN: [id number] Endoscopist: Gatha Mayer , MD Age: 62 Referring MD:  Date of Birth: 06-28-1955 Gender: Female Account #: [id number] Procedure:                Colonoscopy Indications:              Screening for colorectal malignant neoplasm, Last                            colonoscopy: 2007 (Inflammatory polyp then) Medicines:                Propofol per Anesthesia, Monitored Anesthesia Care Procedure:                Pre-Anesthesia Assessment:                           - Prior to the procedure, a History and Physical                            was performed, and patient medications and                            allergies were reviewed. The patient's tolerance of                            previous anesthesia was also reviewed. The risks                            and benefits of the procedure and the sedation                            options and risks were discussed with the patient.                            All questions were answered, and informed consent                            was obtained. Prior Anticoagulants: The patient has                            taken no previous anticoagulant or antiplatelet                            agents. ASA Grade Assessment: II - A patient with                            mild systemic disease. After reviewing the risks                            and benefits, the patient was deemed in                            satisfactory condition to undergo the procedure.  After obtaining informed consent, the colonoscope                            was passed under direct vision. Throughout the                            procedure, the patient's blood pressure, pulse, and                            oxygen saturations were monitored continuously. The                            Colonoscope was introduced through the anus and           advanced to the the cecum, identified by                            appendiceal orifice and ileocecal valve. The                            patient tolerated the procedure well. The quality                            of the bowel preparation was good. The ileocecal                            valve, appendiceal orifice, and rectum were                            photographed. The bowel preparation used was                            Miralax. The colonoscopy was somewhat difficult due                            to restricted mobility of the colon. Successful                            completion of the procedure was aided by changing                            the patient's position. Scope In: 10:46:58 AM Scope Out: 11:04:38 AM Scope Withdrawal Time: 0 hours 12 minutes 42 seconds  Total Procedure Duration: 0 hours 17 minutes 40 seconds  Findings:                 The perianal and digital rectal examinations were                            normal.                           A 3 mm polyp was found in the ascending colon. The  polyp was sessile. The polyp was removed with a                            cold snare. Resection and retrieval were complete.                            Verification of patient identification for the                            specimen was done. Estimated blood loss was minimal.                           Multiple diverticula were found in the sigmoid                            colon. There was narrowing of the colon in                            association with the diverticular opening.                           The exam was otherwise without abnormality on                            direct and retroflexion views. Complications:            No immediate complications. Estimated Blood Loss:     Estimated blood loss was minimal. Impression:               - One 3 mm polyp in the ascending colon, removed                            with a cold  snare. Resected and retrieved.                           - Severe diverticulosis in the sigmoid colon. There                            was narrowing of the colon in association with the                            diverticular opening.                           - The examination was otherwise normal on direct                            and retroflexion views. Recommendation:           - Patient has a contact number available for                            emergencies. The signs and symptoms of potential  delayed complications were discussed with the                            patient. Return to normal activities tomorrow.                            Written discharge instructions were provided to the                            patient.                           - Resume previous diet.                           - Continue present medications.                           - Repeat colonoscopy is recommended. The                            colonoscopy date will be determined after pathology                            results from today's exam become available for                            review. Gatha Mayer, MD 01/31/2018 11:13:24 AM This report has been signed electronically.

## 2018-01-31 NOTE — Progress Notes (Signed)
Called to room to assist during endoscopic procedure.  Patient ID and intended procedure confirmed with present staff. Received instructions for my participation in the procedure from the performing physician.  

## 2018-02-01 ENCOUNTER — Telehealth: Payer: Self-pay | Admitting: *Deleted

## 2018-02-01 NOTE — Telephone Encounter (Signed)
  Follow up Call-  Call back number 01/31/2018  Post procedure Call Back phone  # [phone]  Permission to leave phone message Yes  Some recent data might be hidden     Patient questions:  Do you have a fever, pain , or abdominal swelling? No. Pain Score  0 *  Have you tolerated food without any problems? Yes.    Have you been able to return to your normal activities? Yes.    Do you have any questions about your discharge instructions: Diet   No. Medications  No. Follow up visit  No.  Do you have questions or concerns about your Care? No.  Actions: * If pain score is 4 or above: No action needed, pain <4.

## 2018-02-04 MED FILL — NOVOLOG FLEXPEN SYRINGE: 100 | 42 days supply | Qty: 15 | Fill #8

## 2018-02-04 MED FILL — BD PEN NDL NANO 32GX5/32: 32G X 4 MM | 25 days supply | Qty: 100 | Fill #8

## 2018-02-04 MED FILL — metFORMIN HCL 500 MG TABS: 500 | 30 days supply | Qty: 60 | Fill #4

## 2018-02-04 MED FILL — BD PEN NDL NANO 32GX5/32": 32G X 4 MM | 25 days supply | Qty: 100 | Fill #8

## 2018-02-06 ENCOUNTER — Encounter: Payer: Self-pay | Admitting: Internal Medicine

## 2018-02-06 DIAGNOSIS — Z860101 Personal history of adenomatous and serrated colon polyps: Secondary | ICD-10-CM

## 2018-02-06 DIAGNOSIS — Z86010 Personal history of colonic polyps: Secondary | ICD-10-CM

## 2018-02-06 HISTORY — DX: Personal history of adenomatous and serrated colon polyps: Z86.0101

## 2018-02-06 HISTORY — DX: Personal history of colonic polyps: Z86.010

## 2018-02-06 NOTE — Progress Notes (Signed)
One diminutive adenoma Recall 2024

## 2018-02-21 MED FILL — LOSARTAN POTASSIUM 100 MG T: 100 | 90 days supply | Qty: 90 | Fill #4

## 2018-02-21 MED FILL — SIMVASTATIN 20 MG TABLET: 20 | 90 days supply | Qty: 90 | Fill #4

## 2018-02-21 MED FILL — CARTIA XT 300 MG CAPSULE SA: 300 | 90 days supply | Qty: 90 | Fill #4

## 2018-02-21 MED FILL — HYDROCHLOROTHIAZIDE 12.5 MG: 12.5 | 90 days supply | Qty: 180 | Fill #4

## 2018-02-21 MED FILL — LEVEMIR FLEXTOUCH 100 UNITS: 100 | 30 days supply | Qty: 15 | Fill #10

## 2018-03-03 MED FILL — BD PEN NDL NANO 32GX5/32: 32G X 4 MM | 25 days supply | Qty: 100 | Fill #9

## 2018-03-03 MED FILL — BD PEN NDL NANO 32GX5/32": 32G X 4 MM | 25 days supply | Qty: 100 | Fill #9

## 2018-03-04 ENCOUNTER — Other Ambulatory Visit: Payer: Self-pay

## 2018-03-04 MED ORDER — MELOXICAM 15 MG PO TABS: 15.0000 mg | ORAL_TABLET | Freq: Every day | ORAL | 3 refills | Status: DC

## 2018-03-04 MED FILL — MELOXICAM 15 MG TABLET: 15 | 90 days supply | Qty: 90 | Fill #0

## 2018-03-15 MED FILL — ONE TOUCH ULTRA TEST STRIPS: 50 days supply | Qty: 200 | Fill #6

## 2018-03-21 MED FILL — LEVEMIR FLEXTOUCH 100 UNITS: 100 | 30 days supply | Qty: 15 | Fill #11

## 2018-03-24 ENCOUNTER — Other Ambulatory Visit: Payer: Self-pay | Admitting: Internal Medicine

## 2018-03-24 MED FILL — ATENOLOL 50 MG TABLET: 50 | 90 days supply | Qty: 90 | Fill #1

## 2018-03-24 MED FILL — NOVOLOG FLEXPEN SYRINGE: 100 | 42 days supply | Qty: 15 | Fill #0

## 2018-03-24 MED FILL — metFORMIN HCL 500 MG TABS: 500 | 30 days supply | Qty: 60 | Fill #5

## 2018-03-24 MED FILL — BD PEN NDL NANO 32GX5/32": 32G X 4 MM | 25 days supply | Qty: 100 | Fill #10

## 2018-03-24 MED FILL — BD PEN NDL NANO 32GX5/32: 32G X 4 MM | 25 days supply | Qty: 100 | Fill #10

## 2018-04-22 ENCOUNTER — Other Ambulatory Visit: Payer: Self-pay | Admitting: Internal Medicine

## 2018-04-22 MED FILL — LEVEMIR FLEXTOUCH 100 UNITS: 100 | 30 days supply | Qty: 15 | Fill #0

## 2018-04-22 MED FILL — BD PEN NDL NANO 32GX5/32": 32G X 4 MM | 25 days supply | Qty: 100 | Fill #11

## 2018-04-22 MED FILL — metFORMIN HCL 500 MG TABS: 500 | 30 days supply | Qty: 60 | Fill #6

## 2018-04-22 MED FILL — BD PEN NDL NANO 32GX5/32: 32G X 4 MM | 25 days supply | Qty: 100 | Fill #11

## 2018-05-04 MED FILL — NOVOLOG FLEXPEN SYRINGE: 100 | 42 days supply | Qty: 15 | Fill #1 | Status: TO

## 2018-05-04 MED FILL — ONE TOUCH ULTRA TEST STRIPS: 50 days supply | Qty: 200 | Fill #7

## 2018-05-20 MED FILL — BD PEN NDL NANO 32GX5/32: 32G X 4 MM | 25 days supply | Qty: 100 | Fill #12 | Status: TO

## 2018-05-20 MED FILL — LEVEMIR FLEXTOUCH 100 UNITS: 100 | 30 days supply | Qty: 15 | Fill #1 | Status: TO

## 2018-05-20 MED FILL — metFORMIN HCL 500 MG TABS: 500 | 30 days supply | Qty: 60 | Fill #7 | Status: TO

## 2018-05-20 MED FILL — BD PEN NDL NANO 32GX5/32": 32G X 4 MM | 25 days supply | Qty: 100 | Fill #12 | Status: TO

## 2018-05-23 ENCOUNTER — Other Ambulatory Visit: Payer: Self-pay | Admitting: Internal Medicine

## 2018-05-23 MED FILL — LOSARTAN POTASSIUM 100 MG T: 100 | 90 days supply | Qty: 90 | Fill #0

## 2018-05-23 MED FILL — CARTIA XT 300 MG CAPSULE SA: 300 | 90 days supply | Qty: 90 | Fill #0

## 2018-05-23 MED FILL — HYDROCHLOROTHIAZIDE 12.5 MG: 12.5 | 90 days supply | Qty: 180 | Fill #0

## 2018-05-23 MED FILL — SIMVASTATIN 20 MG TABLET: 20 | 90 days supply | Qty: 90 | Fill #0

## 2018-06-07 ENCOUNTER — Other Ambulatory Visit: Payer: Self-pay

## 2018-06-07 ENCOUNTER — Other Ambulatory Visit: Payer: BC Managed Care – PPO | Admitting: Internal Medicine

## 2018-06-07 VITALS — Temp 98.5°F

## 2018-06-07 DIAGNOSIS — IMO0001 Reserved for inherently not codable concepts without codable children: Secondary | ICD-10-CM

## 2018-06-07 DIAGNOSIS — E669 Obesity, unspecified: Secondary | ICD-10-CM

## 2018-06-07 DIAGNOSIS — E119 Type 2 diabetes mellitus without complications: Secondary | ICD-10-CM

## 2018-06-07 DIAGNOSIS — E6609 Other obesity due to excess calories: Secondary | ICD-10-CM

## 2018-06-07 DIAGNOSIS — Z794 Long term (current) use of insulin: Secondary | ICD-10-CM

## 2018-06-07 DIAGNOSIS — I10 Essential (primary) hypertension: Secondary | ICD-10-CM

## 2018-06-07 DIAGNOSIS — Z6830 Body mass index (BMI) 30.0-30.9, adult: Secondary | ICD-10-CM

## 2018-06-08 LAB — CBC WITH DIFFERENTIAL/PLATELET
Absolute Monocytes: 461 cells/uL (ref 200–950)
BASOS PCT: 0.9 %
Basophils Absolute: 58 cells/uL (ref 0–200)
Eosinophils Absolute: 397 cells/uL (ref 15–500)
Eosinophils Relative: 6.2 %
HCT: 42.2 % (ref 35.0–45.0)
Hemoglobin: 14 g/dL (ref 11.7–15.5)
Lymphs Abs: 2509 cells/uL (ref 850–3900)
MCH: 29 pg (ref 27.0–33.0)
MCHC: 33.2 g/dL (ref 32.0–36.0)
MCV: 87.6 fL (ref 80.0–100.0)
MPV: 10.6 fL (ref 7.5–12.5)
Monocytes Relative: 7.2 %
Neutro Abs: 2976 cells/uL (ref 1500–7800)
Neutrophils Relative %: 46.5 %
Platelets: 265 10*3/uL (ref 140–400)
RBC: 4.82 10*6/uL (ref 3.80–5.10)
RDW: 13.3 % (ref 11.0–15.0)
Total Lymphocyte: 39.2 %
WBC: 6.4 10*3/uL (ref 3.8–10.8)

## 2018-06-08 LAB — COMPLETE METABOLIC PANEL WITH GFR
AG Ratio: 1.3 (calc) (ref 1.0–2.5)
ALT: 29 U/L (ref 6–29)
AST: 26 U/L (ref 10–35)
Albumin: 4.2 g/dL (ref 3.6–5.1)
Alkaline phosphatase (APISO): 54 U/L (ref 37–153)
BILIRUBIN TOTAL: 0.4 mg/dL (ref 0.2–1.2)
BUN: 18 mg/dL (ref 7–25)
CO2: 27 mmol/L (ref 20–32)
Calcium: 10 mg/dL (ref 8.6–10.4)
Chloride: 101 mmol/L (ref 98–110)
Creat: 0.79 mg/dL (ref 0.50–0.99)
GFR, Est African American: 92 mL/min/{1.73_m2} (ref >=60)
GFR, Est Non African American: 79 mL/min/{1.73_m2} (ref >=60)
Globulin: 3.2 g/dL (calc) (ref 1.9–3.7)
Glucose, Bld: 132 mg/dL — ABNORMAL HIGH (ref 65–99)
Potassium: 4 mmol/L (ref 3.5–5.3)
Sodium: 139 mmol/L (ref 135–146)
Total Protein: 7.4 g/dL (ref 6.1–8.1)

## 2018-06-08 LAB — LIPID PANEL
Cholesterol: 181 mg/dL (ref <200)
HDL: 51 mg/dL (ref >=50)
LDL Cholesterol (Calc): 108 mg/dL (calc) — ABNORMAL HIGH
Non-HDL Cholesterol (Calc): 130 mg/dL (calc) — ABNORMAL HIGH (ref <130)
Total CHOL/HDL Ratio: 3.5 (calc) (ref <5.0)
Triglycerides: 109 mg/dL (ref <150)

## 2018-06-08 LAB — VITAMIN D 25 HYDROXY (VIT D DEFICIENCY, FRACTURES): Vit D, 25-Hydroxy: 38 ng/mL (ref 30–100)

## 2018-06-08 LAB — TSH: TSH: 1.02 m[IU]/L (ref 0.40–4.50)

## 2018-06-10 ENCOUNTER — Encounter: Payer: BC Managed Care – PPO | Admitting: Internal Medicine

## 2018-06-14 ENCOUNTER — Ambulatory Visit (INDEPENDENT_AMBULATORY_CARE_PROVIDER_SITE_OTHER): Payer: BC Managed Care – PPO | Admitting: Internal Medicine

## 2018-06-14 ENCOUNTER — Other Ambulatory Visit: Payer: Self-pay | Admitting: Internal Medicine

## 2018-06-14 ENCOUNTER — Other Ambulatory Visit: Payer: Self-pay

## 2018-06-14 ENCOUNTER — Encounter: Payer: Self-pay | Admitting: Internal Medicine

## 2018-06-14 VITALS — BP 120/80 | HR 73 | Temp 98.3°F | Ht 62.0 in | Wt 166.0 lb

## 2018-06-14 DIAGNOSIS — I10 Essential (primary) hypertension: Secondary | ICD-10-CM

## 2018-06-14 DIAGNOSIS — Z96652 Presence of left artificial knee joint: Secondary | ICD-10-CM

## 2018-06-14 DIAGNOSIS — R7302 Impaired glucose tolerance (oral): Secondary | ICD-10-CM

## 2018-06-14 DIAGNOSIS — Z794 Long term (current) use of insulin: Principal | ICD-10-CM

## 2018-06-14 DIAGNOSIS — IMO0001 Reserved for inherently not codable concepts without codable children: Secondary | ICD-10-CM

## 2018-06-14 DIAGNOSIS — Z0000 Encounter for general adult medical examination without abnormal findings: Secondary | ICD-10-CM

## 2018-06-14 DIAGNOSIS — E119 Type 2 diabetes mellitus without complications: Secondary | ICD-10-CM | POA: Diagnosis not present

## 2018-06-14 DIAGNOSIS — E8881 Metabolic syndrome: Secondary | ICD-10-CM

## 2018-06-14 LAB — POCT URINALYSIS DIPSTICK
Appearance: NEGATIVE
Bilirubin, UA: NEGATIVE
Glucose, UA: NEGATIVE
Ketones, UA: NEGATIVE
Leukocytes, UA: NEGATIVE
Nitrite, UA: NEGATIVE
Odor: NEGATIVE
Protein, UA: NEGATIVE
RBC UA: NEGATIVE
Spec Grav, UA: 1.01 (ref 1.010–1.025)
Urobilinogen, UA: 0.2 E.U./dL
pH, UA: 6.5 (ref 5.0–8.0)

## 2018-06-14 MED FILL — BD PEN NDL NANO 32GX5/32": 32G X 4 MM | 25 days supply | Qty: 100 | Fill #0

## 2018-06-14 MED FILL — NOVOLOG FLEXPEN SYRINGE: 100 | 42 days supply | Qty: 15 | Fill #0

## 2018-06-14 MED FILL — BD PEN NDL NANO 32GX5/32: 32G X 4 MM | 25 days supply | Qty: 100 | Fill #0

## 2018-06-14 MED FILL — LEVEMIR FLEXTOUCH 100 UNITS: 100 | 30 days supply | Qty: 15 | Fill #0

## 2018-06-14 MED FILL — ONE TOUCH ULTRA TEST STRIPS: 25 days supply | Qty: 100 | Fill #0

## 2018-06-14 MED FILL — metFORMIN HCL 500 MG TABS: 500 | 30 days supply | Qty: 60 | Fill #0

## 2018-06-14 NOTE — Patient Instructions (Signed)
It was a pleasure to see you today.  Hemoglobin A1c drawn and pending.  Other labs are excellent.  Continue to watch diet and get exercise.  Return in 6 months.

## 2018-06-14 NOTE — Progress Notes (Signed)
Subjective:    Patient ID: Lisa Robertson, female    DOB: [birth year], 63 y.o.   MRN: [id number]  HPI 63 year old Female for health maintenance exam and evaluation of medical issues.  A year ago had left knee arthroplasty and is doing well.  She has a history of insulin-dependent diabetes mellitus.  Hemoglobin A1c is stable.  History of essential hypertension which is stable.  Wears glasses and has annual diabetic eye exam.  Past medical history: History of bilateral tubal ligation.  History of total abdominal hysterectomy without oophorectomy for fibroids by Dr. Nori Riis in Jun 22, 1996.  History of hypertension since 22-Jun-1976.  Has been to diabetic educator at diabetes treatment center.  She is on statin therapy.  History of obesity.  Social history: She is married.  2 sons and 1 daughter.  Does not smoke or consume alcohol.  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  Family history: Son with history of kidney transplant from uncontrolled hypertension.  Father with history of hypertension, prostate cancer and diabetes along with heart disease.  Mother died of a stroke in 06/22/2016 with history of diabetes and hypertension.  3 sisters and brothers.      Review of Systems  Constitutional: Negative.   Respiratory: Negative.   Cardiovascular: Negative.   Gastrointestinal: Negative.   Genitourinary: Negative.   Neurological: Negative.   Psychiatric/Behavioral: Negative.   All other systems reviewed and are negative.      Objective:   Physical Exam Vitals signs reviewed.  Constitutional:      General: She is not in acute distress.    Appearance: Normal appearance. She is not ill-appearing.  HENT:     Head: Normocephalic and atraumatic.     Right Ear: Tympanic membrane normal.     Left Ear: Tympanic membrane normal.     Nose: Nose normal. No congestion or rhinorrhea.   Mouth/Throat:     Mouth: Mucous membranes are moist.     Pharynx: Oropharynx is clear. No oropharyngeal exudate.  Eyes:     General: No scleral icterus.       Right eye: No discharge.        Left eye: No discharge.     Extraocular Movements: Extraocular movements intact.     Conjunctiva/sclera: Conjunctivae normal.     Pupils: Pupils are equal, round, and reactive to light.  Neck:     Musculoskeletal: Neck supple. No neck rigidity.     Vascular: No carotid bruit.     Comments: No thyromegaly.  No adenopathy. Cardiovascular:     Rate and Rhythm: Normal rate and regular rhythm.     Pulses: Normal pulses.     Heart sounds: Normal heart sounds. No murmur.  Pulmonary:     Effort: Pulmonary effort is normal. No respiratory distress.     Robertson sounds: Normal Robertson sounds. No wheezing, rhonchi or rales.  Abdominal:     General: Bowel sounds are normal. There is no distension.     Palpations: Abdomen is soft. There is no mass.     Tenderness: There is no abdominal tenderness. There is no right CVA tenderness or left CVA tenderness.  Genitourinary:    Comments: Pap deferred status post total abdominal hysterectomy.  Bimanual normal. Musculoskeletal:     Right lower leg: No edema.     Left lower leg: No edema.  Lymphadenopathy:  Cervical: No cervical adenopathy.  Skin:    General: Skin is warm and dry.     Findings: No rash.  Neurological:     General: No focal deficit present.     Mental Status: She is alert and oriented to person, place, and time.     Cranial Nerves: No cranial nerve deficit.     Sensory: No sensory deficit.     Motor: No weakness.     Gait: Gait normal.  Psychiatric:        Mood and Affect: Mood normal.        Behavior: Behavior normal.        Thought Content: Thought content normal.        Judgment: Judgment normal.           Assessment & Plan:  Insulin-dependent diabetes mellitus-hemoglobin A1c had to be drawn today.  It was inadvertently  overlooked at recent lab draw.  Diabetic foot exam is normal.  Hyperlipidemia-on statin therapy and LDL is 108 and other lipid values are normal.  Continue statin therapy.  Essential hypertension-stable on current regimen  Obesity-weight stable at 166 pounds-BMI 30.36 continue to work on diet exercise  Status post left knee arthroplasty and doing well.  Plan: Hemoglobin A1c pending.  Return in 6 months.  Have annual mammogram.  Have annual flu vaccine.  Continue diet and exercise efforts.

## 2018-06-15 LAB — HEMOGLOBIN A1C
Hgb A1c MFr Bld: 6.2 % of total Hgb — ABNORMAL HIGH (ref <5.7)
MEAN PLASMA GLUCOSE: 131 (calc)
eAG (mmol/L): 7.3 (calc)

## 2018-06-23 MED FILL — ATENOLOL 50 MG TABLET: 50 | 90 days supply | Qty: 90 | Fill #0

## 2018-07-05 MED FILL — ONE TOUCH ULTRA TEST STRIPS: 25 days supply | Qty: 100 | Fill #1

## 2018-07-05 MED FILL — BD PEN NDL NANO 32GX5/32: 32G X 4 MM | 25 days supply | Qty: 100 | Fill #1

## 2018-07-05 MED FILL — BD PEN NDL NANO 32GX5/32": 32G X 4 MM | 25 days supply | Qty: 100 | Fill #1

## 2018-07-05 MED FILL — LEVEMIR FLEXTOUCH 100 UNITS: 100 | 30 days supply | Qty: 15 | Fill #1

## 2018-07-05 MED FILL — metFORMIN HCL 500 MG TABS: 500 | 30 days supply | Qty: 60 | Fill #1

## 2018-07-06 MED FILL — MELOXICAM 15 MG TABLET: 15 | 90 days supply | Qty: 90 | Fill #0

## 2018-07-22 MED FILL — NOVOLOG FLEXPEN SYRINGE: 100 | 42 days supply | Qty: 15 | Fill #1

## 2018-08-01 ENCOUNTER — Other Ambulatory Visit: Payer: Self-pay | Admitting: Internal Medicine

## 2018-08-01 ENCOUNTER — Telehealth: Payer: Self-pay | Admitting: Internal Medicine

## 2018-08-01 MED FILL — ONE TOUCH ULTRA TEST STRIPS: 25 days supply | Qty: 100 | Fill #2

## 2018-08-01 MED FILL — BD PEN NDL NANO 32GX5/32": 32G X 4 MM | 90 days supply | Qty: 100 | Fill #0

## 2018-08-01 MED FILL — LEVEMIR FLEXTOUCH 100 UNITS: 100 | 30 days supply | Qty: 15 | Fill #2

## 2018-08-01 MED FILL — metFORMIN HCL 500 MG TABS: 500 | 30 days supply | Qty: 60 | Fill #2

## 2018-08-01 MED FILL — BD PEN NDL NANO 32GX5/32: 32G X 4 MM | 90 days supply | Qty: 100 | Fill #0

## 2018-08-01 NOTE — Telephone Encounter (Signed)
Refill already sent in. 

## 2018-08-25 MED FILL — LEVEMIR FLEXTOUCH 100 UNITS: 100 | 30 days supply | Qty: 15 | Fill #0

## 2018-08-25 MED FILL — BD PEN NDL NANO 32GX5/32: 32G X 4 MM | 25 days supply | Qty: 100 | Fill #0

## 2018-08-25 MED FILL — metFORMIN HCL 500 MG TABS: 500 | 30 days supply | Qty: 60 | Fill #0

## 2018-08-25 MED FILL — SIMVASTATIN 20 MG TABLET: 20 | 90 days supply | Qty: 90 | Fill #1

## 2018-08-25 MED FILL — CARTIA XT 300 MG CAPSULE SA: 300 | 90 days supply | Qty: 90 | Fill #1

## 2018-08-25 MED FILL — LOSARTAN POTASSIUM 100 MG T: 100 | 90 days supply | Qty: 90 | Fill #1

## 2018-08-25 MED FILL — HYDROCHLOROTHIAZIDE 12.5 MG: 12.5 | 90 days supply | Qty: 180 | Fill #1

## 2018-08-25 MED FILL — NOVOLOG FLEXPEN SYRINGE: 100 | 42 days supply | Qty: 15 | Fill #0

## 2018-08-25 MED FILL — BD PEN NDL NANO 32GX5/32": 32G X 4 MM | 25 days supply | Qty: 100 | Fill #0

## 2018-08-25 MED FILL — ONE TOUCH ULTRA TEST STRIPS: 25 days supply | Qty: 100 | Fill #0

## 2018-09-19 MED FILL — BD PEN NDL NANO 32GX5/32": 32G X 4 MM | 25 days supply | Qty: 100 | Fill #1

## 2018-09-19 MED FILL — ONE TOUCH ULTRA TEST STRIPS: 25 days supply | Qty: 100 | Fill #1

## 2018-09-19 MED FILL — BD PEN NDL NANO 32GX5/32: 32G X 4 MM | 25 days supply | Qty: 100 | Fill #1

## 2018-09-26 ENCOUNTER — Other Ambulatory Visit: Payer: Self-pay | Admitting: Internal Medicine

## 2018-09-26 MED FILL — metFORMIN HCL 500 MG TABS: 500 | 90 days supply | Qty: 180 | Fill #0

## 2018-09-26 MED FILL — ATENOLOL 50 MG TABLET: 50 | 90 days supply | Qty: 90 | Fill #0

## 2018-09-26 MED FILL — LEVEMIR FLEXTOUCH 100 UNITS: 100 | 30 days supply | Qty: 15 | Fill #1

## 2018-10-10 MED FILL — BD PEN NDL NANO 32GX5/32": 32G X 4 MM | 25 days supply | Qty: 100 | Fill #2

## 2018-10-10 MED FILL — ONE TOUCH ULTRA TEST STRIPS: 25 days supply | Qty: 100 | Fill #2

## 2018-10-10 MED FILL — BD PEN NDL NANO 32GX5/32: 32G X 4 MM | 25 days supply | Qty: 100 | Fill #2

## 2018-10-10 MED FILL — NOVOLOG FLEXPEN SYRINGE: 100 | 42 days supply | Qty: 15 | Fill #1

## 2018-10-10 MED FILL — MELOXICAM 15 MG TABLET: 15 | 90 days supply | Qty: 90 | Fill #0

## 2018-10-26 MED FILL — LEVEMIR FLEXTOUCH 100 UNITS: 100 | 30 days supply | Qty: 15 | Fill #2

## 2018-11-04 MED FILL — BD PEN NDL NANO 32GX5/32: 32G X 4 MM | 25 days supply | Qty: 100 | Fill #3

## 2018-11-04 MED FILL — BD PEN NDL NANO 32GX5/32": 32G X 4 MM | 25 days supply | Qty: 100 | Fill #3

## 2018-11-04 MED FILL — ONE TOUCH ULTRA TEST STRIPS: 25 days supply | Qty: 100 | Fill #3

## 2018-11-25 MED FILL — CARTIA XT 300 MG CAPSULE SA: 300 | 90 days supply | Qty: 90 | Fill #2

## 2018-11-25 MED FILL — LEVEMIR FLEXTOUCH 100 UNITS: 100 | 30 days supply | Qty: 15 | Fill #3

## 2018-11-25 MED FILL — BD PEN NDL NANO 32GX5/32: 32G X 4 MM | 25 days supply | Qty: 100 | Fill #4

## 2018-11-25 MED FILL — HYDROCHLOROTHIAZIDE 12.5 MG: 12.5 | 90 days supply | Qty: 180 | Fill #2

## 2018-11-25 MED FILL — LOSARTAN POTASSIUM 100 MG T: 100 | 90 days supply | Qty: 90 | Fill #2

## 2018-11-25 MED FILL — ONE TOUCH ULTRA TEST STRIPS: 25 days supply | Qty: 100 | Fill #4

## 2018-11-25 MED FILL — NOVOLOG FLEXPEN SYRINGE: 100 | 42 days supply | Qty: 15 | Fill #2

## 2018-11-25 MED FILL — SIMVASTATIN 20 MG TABLET: 20 | 90 days supply | Qty: 90 | Fill #2

## 2018-11-28 MED FILL — AMOXICILLIN 500 MG CAPSULE: 500 | 30 days supply | Qty: 8 | Fill #0

## 2018-12-30 ENCOUNTER — Other Ambulatory Visit: Payer: Self-pay | Admitting: Internal Medicine

## 2018-12-30 MED FILL — BD PEN NDL NANO 32GX5/32: 32G X 4 MM | 25 days supply | Qty: 100 | Fill #5

## 2018-12-30 MED FILL — BD PEN NDL NANO 32GX5/32": 32G X 4 MM | 25 days supply | Qty: 100 | Fill #5

## 2018-12-30 MED FILL — ATENOLOL 50 MG TABLET: 50 | 90 days supply | Qty: 90 | Fill #0

## 2018-12-30 MED FILL — LEVEMIR FLEXTOUCH 100 UNITS: 100 | 30 days supply | Qty: 15 | Fill #4

## 2018-12-30 MED FILL — ONE TOUCH ULTRA TEST STRIPS: 25 days supply | Qty: 100 | Fill #5

## 2018-12-30 MED FILL — metFORMIN HCL 500 MG TABS: 500 | 90 days supply | Qty: 180 | Fill #1

## 2019-01-09 MED FILL — NOVOLOG FLEXPEN SYRINGE: 100 | 42 days supply | Qty: 15 | Fill #3

## 2019-01-26 MED FILL — MELOXICAM 15 MG TABLET: 15 | 90 days supply | Qty: 90 | Fill #1

## 2019-01-26 MED FILL — BD PEN NDL NANO 32GX5/32": 32G X 4 MM | 25 days supply | Qty: 100 | Fill #6

## 2019-01-26 MED FILL — ONE TOUCH ULTRA TEST STRIPS: 25 days supply | Qty: 100 | Fill #6

## 2019-01-26 MED FILL — LEVEMIR FLEXTOUCH 100 UNITS: 100 | 30 days supply | Qty: 15 | Fill #5

## 2019-01-26 MED FILL — BD PEN NDL NANO 32GX5/32: 32G X 4 MM | 25 days supply | Qty: 100 | Fill #6

## 2019-02-12 IMAGING — MG DIGITAL SCREENING BILATERAL MAMMOGRAM WITH TOMO AND CAD
8 series · 8 of 24 positions shown · non-contrast
Comparison: none

ACR Breast Density Category b: There are scattered areas of
fibroglandular density.

[R MLO synth-2D]
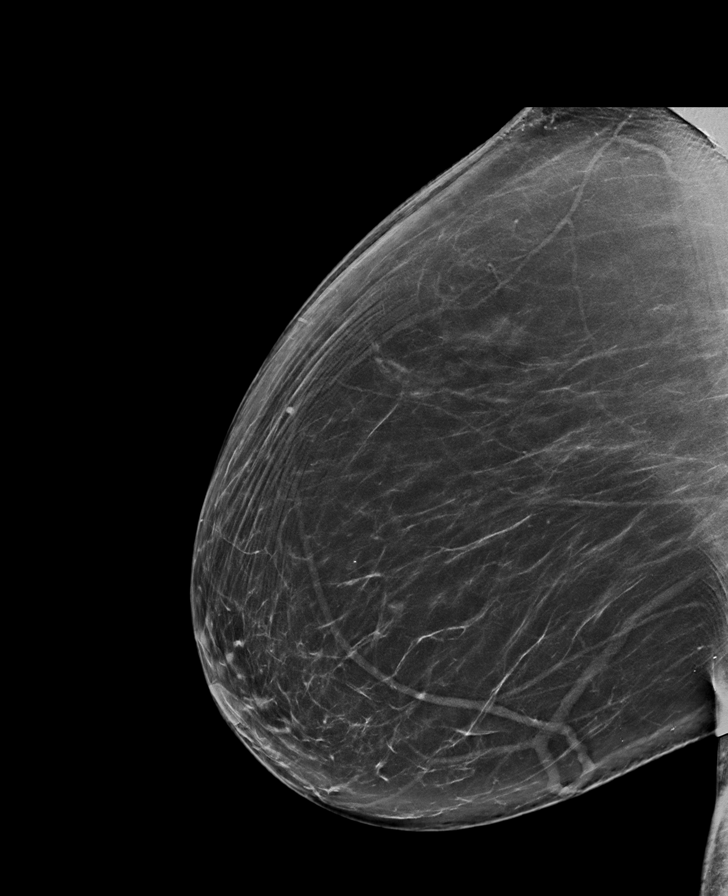

[L MLO synth-2D]
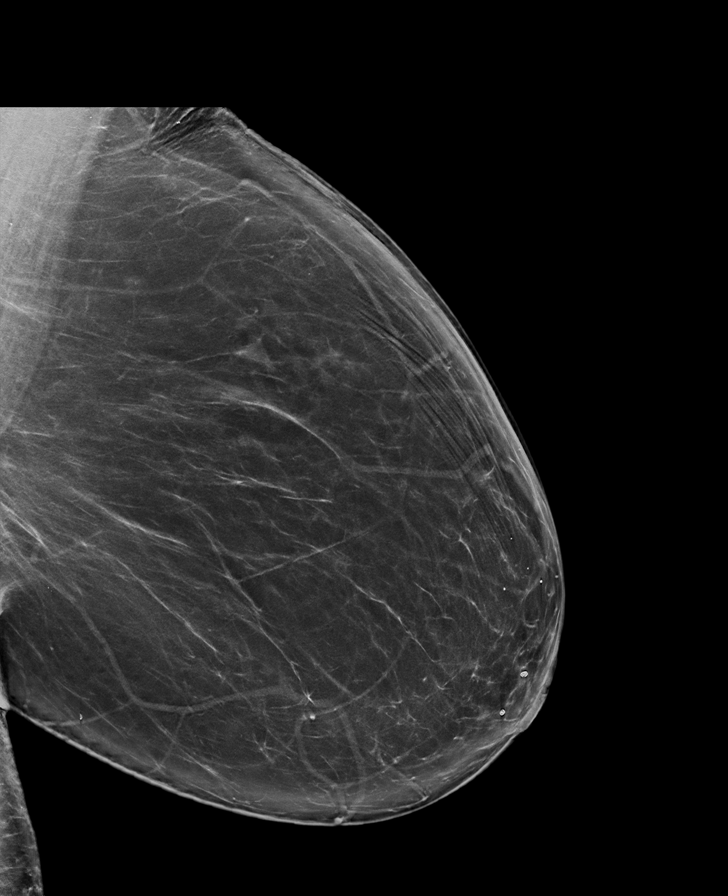

[R CC synth-2D]
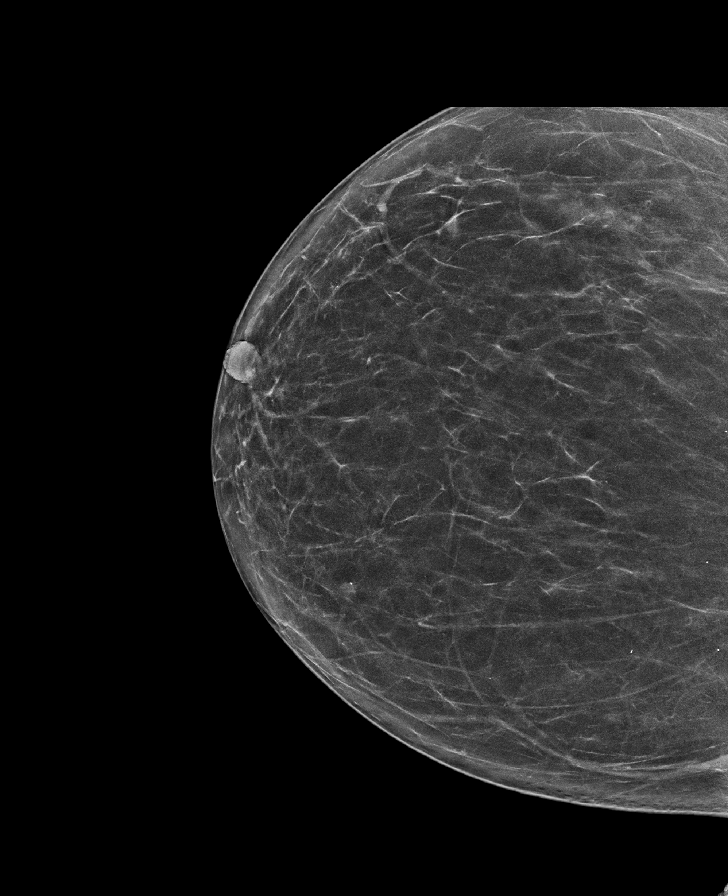

[L CC synth-2D]
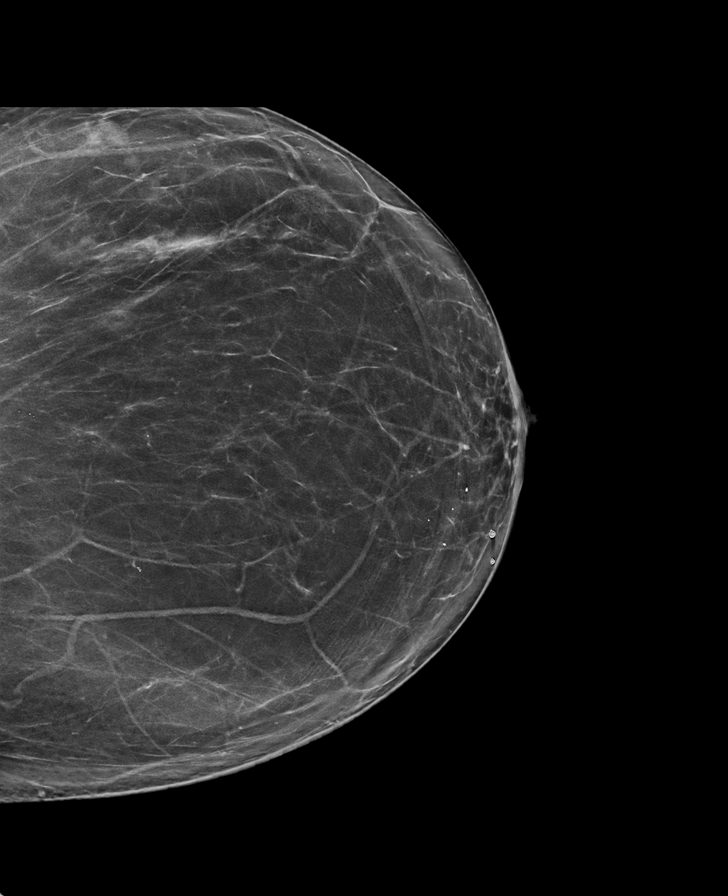

[L CC tomo · tomo slice 39/78.0]
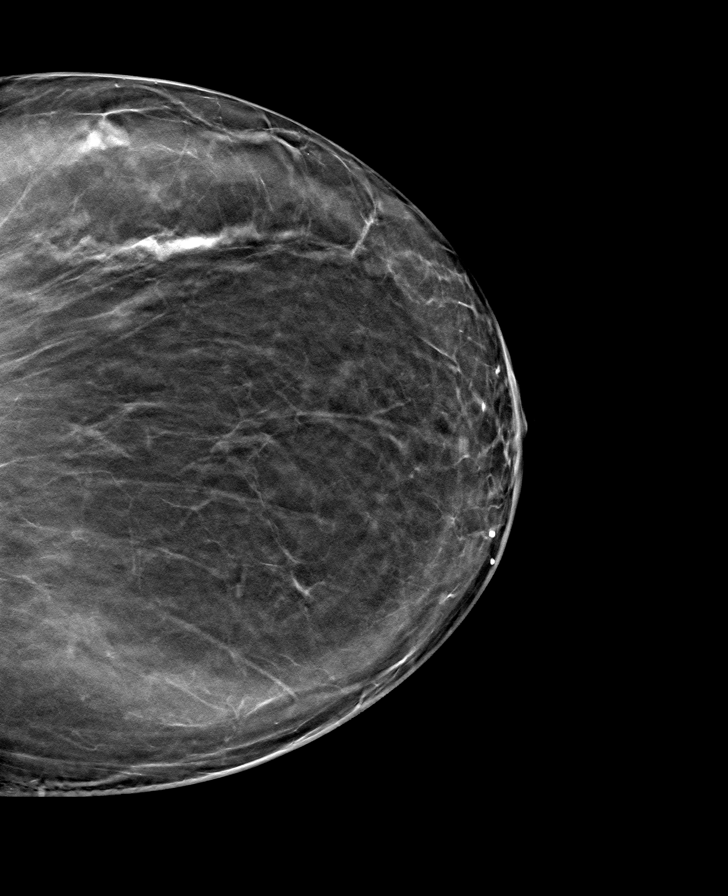

[R CC tomo · tomo slice 38/75.0]
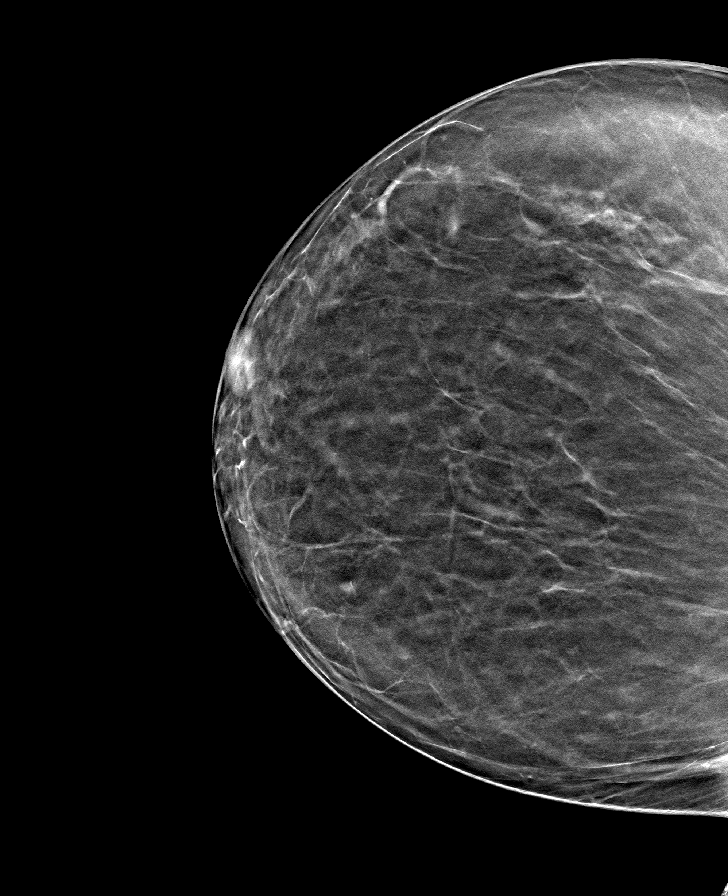

[L MLO tomo · tomo slice 48/95.0]
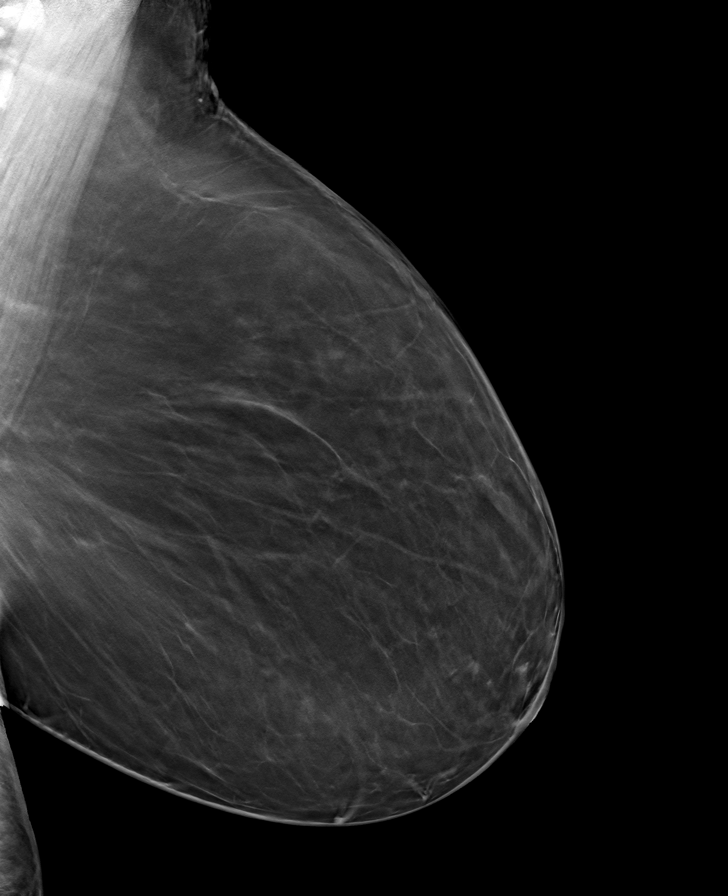

[R MLO tomo · tomo slice 48/95.0]
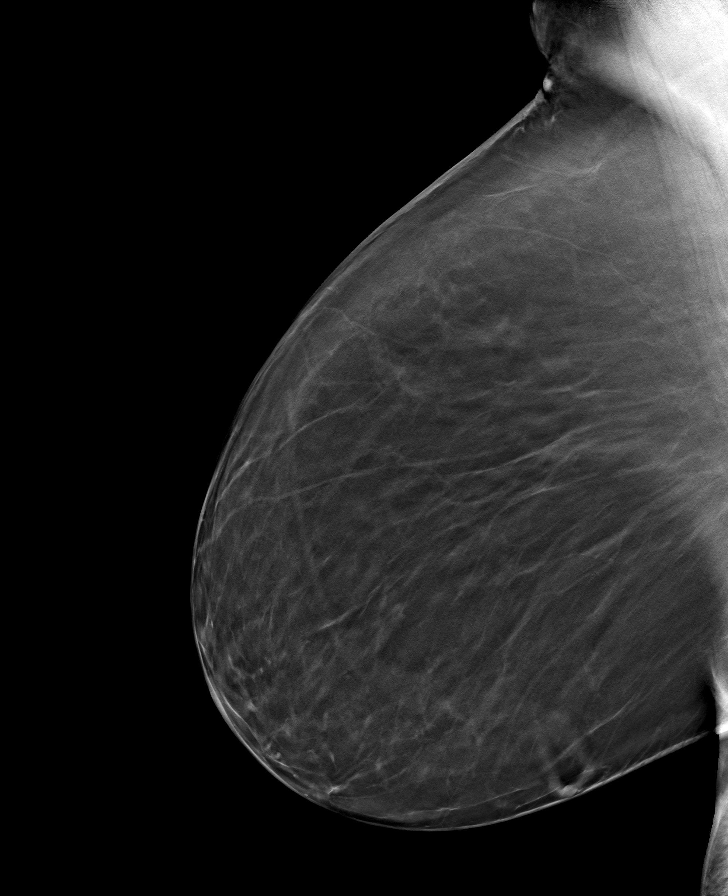

[8 of 24 positions shown; findings below may reference images not displayed]

FINDINGS: Mammographically, there are no suspicious masses, areas of
architectural distortion or microcalcifications in either breast.
IMPRESSION: No mammographic evidence of malignancy in either breast.

RECOMMENDATION:
Screening mammogram in one year.(Code:28-0-LNS)

BI-RADS CATEGORY  1: Negative.

:
Please Insert Correct Screening Template

## 2019-02-20 MED FILL — NOVOLOG FLEXPEN SYRINGE: 100 | 42 days supply | Qty: 15 | Fill #4

## 2019-02-20 MED FILL — BD PEN NDL NANO 32GX5/32": 32G X 4 MM | 25 days supply | Qty: 100 | Fill #7

## 2019-02-20 MED FILL — BD PEN NDL NANO 32GX5/32: 32G X 4 MM | 25 days supply | Qty: 100 | Fill #7

## 2019-02-20 MED FILL — LEVEMIR FLEXTOUCH 100 UNITS: 100 | 30 days supply | Qty: 15 | Fill #6

## 2019-02-20 MED FILL — ONE TOUCH ULTRA TEST STRIPS: 25 days supply | Qty: 100 | Fill #7

## 2019-02-24 ENCOUNTER — Telehealth: Payer: Self-pay | Admitting: Internal Medicine

## 2019-02-24 DIAGNOSIS — Z789 Other specified health status: Secondary | ICD-10-CM

## 2019-02-24 NOTE — Telephone Encounter (Signed)
Lisa Robertson dropped off Disability Parking Placard form to be filled out.

## 2019-02-24 NOTE — Telephone Encounter (Signed)
Called to let patient know forms are ready  For pick up.

## 2019-03-06 MED FILL — LOSARTAN POTASSIUM 100 MG T: 100 | 90 days supply | Qty: 90 | Fill #3

## 2019-03-06 MED FILL — SIMVASTATIN 20 MG TABLET: 20 | 90 days supply | Qty: 90 | Fill #3

## 2019-03-06 MED FILL — HYDROCHLOROTHIAZIDE 12.5 MG: 12.5 | 90 days supply | Qty: 180 | Fill #3

## 2019-03-06 MED FILL — CARTIA XT 300 MG CAPSULE SA: 300 | 90 days supply | Qty: 90 | Fill #3

## 2019-03-13 MED FILL — BD PEN NDL NANO 32GX5/32: 32G X 4 MM | 25 days supply | Qty: 100 | Fill #8

## 2019-03-13 MED FILL — ONE TOUCH ULTRA TEST STRIPS: 25 days supply | Qty: 100 | Fill #8

## 2019-03-13 MED FILL — BD PEN NDL NANO 32GX5/32": 32G X 4 MM | 25 days supply | Qty: 100 | Fill #8

## 2019-03-21 ENCOUNTER — Other Ambulatory Visit: Payer: Self-pay | Admitting: Internal Medicine

## 2019-03-21 ENCOUNTER — Other Ambulatory Visit: Payer: Self-pay

## 2019-03-21 MED ORDER — NOVOLOG FLEXPEN 100 UNIT/ML ~~LOC~~ SOPN: PEN_INJECTOR | SUBCUTANEOUS | 11 refills | Status: DC

## 2019-03-21 MED FILL — LEVEMIR FLEXTOUCH 100 UNITS: 100 | 30 days supply | Qty: 15 | Fill #0

## 2019-03-24 MED FILL — NOVOLOG FLEXPEN SYRINGE: 100 | 42 days supply | Qty: 15 | Fill #0

## 2019-03-31 ENCOUNTER — Other Ambulatory Visit: Payer: Self-pay | Admitting: Internal Medicine

## 2019-03-31 MED FILL — ATENOLOL 50 MG TABLET: 50 | 30 days supply | Qty: 30 | Fill #0

## 2019-03-31 MED FILL — metFORMIN HCL 500 MG TABS: 500 | 90 days supply | Qty: 180 | Fill #2

## 2019-04-07 MED FILL — BD PEN NDL NANO 32GX5/32": 32G X 4 MM | 25 days supply | Qty: 100 | Fill #9

## 2019-04-07 MED FILL — BD PEN NDL NANO 32GX5/32: 32G X 4 MM | 25 days supply | Qty: 100 | Fill #9

## 2019-04-07 MED FILL — ONE TOUCH ULTRA TEST STRIPS: 25 days supply | Qty: 100 | Fill #9

## 2019-04-28 ENCOUNTER — Other Ambulatory Visit: Payer: Self-pay | Admitting: Internal Medicine

## 2019-04-28 ENCOUNTER — Other Ambulatory Visit: Payer: Self-pay

## 2019-04-28 DIAGNOSIS — E119 Type 2 diabetes mellitus without complications: Secondary | ICD-10-CM

## 2019-04-28 MED ORDER — GLUCOSE BLOOD VI STRP: ORAL_STRIP | 12 refills | Status: DC

## 2019-04-28 MED ORDER — ACCU-CHEK SAFE-T PRO LANCETS MISC: 12 refills | Status: DC

## 2019-04-28 MED FILL — MELOXICAM 15 MG TABLET: 15 | 90 days supply | Qty: 90 | Fill #0

## 2019-04-28 MED FILL — LEVEMIR FLEXTOUCH 100 UNITS: 100 | 30 days supply | Qty: 15 | Fill #1

## 2019-04-28 MED FILL — ACCU-CHEK GUIDE W/DEVICE KI: W/DEVICE | 1 days supply | Qty: 1 | Fill #0

## 2019-04-28 MED FILL — ATENOLOL 50 MG TABLET: 50 | 30 days supply | Qty: 30 | Fill #1

## 2019-04-28 MED FILL — ACCU-CHEK FASTCLIX LANCETS: 25 days supply | Qty: 102 | Fill #0

## 2019-04-28 MED FILL — ACCU-CHEK GUIDE STRP: 25 days supply | Qty: 100 | Fill #0

## 2019-04-28 MED FILL — BD PEN NDL NANO 32GX5/32": 32G X 4 MM | 25 days supply | Qty: 100 | Fill #10

## 2019-04-28 MED FILL — BD PEN NDL NANO 32GX5/32: 32G X 4 MM | 25 days supply | Qty: 100 | Fill #10

## 2019-05-17 MED FILL — NOVOLOG FLEXPEN SYRINGE: 100 | 59 days supply | Qty: 21 | Fill #0

## 2019-05-25 MED FILL — BD PEN NDL NANO 32GX5/32": 32G X 4 MM | 25 days supply | Qty: 100 | Fill #11

## 2019-05-25 MED FILL — BD PEN NDL NANO 32GX5/32: 32G X 4 MM | 25 days supply | Qty: 100 | Fill #11

## 2019-05-25 MED FILL — LEVEMIR FLEXTOUCH 100 UNITS: 100 | 30 days supply | Qty: 15 | Fill #2

## 2019-05-25 MED FILL — ACCU-CHEK GUIDE STRP: 25 days supply | Qty: 100 | Fill #1

## 2019-05-25 MED FILL — ACCU-CHEK SOFTCLIX LANCETS: 25 days supply | Qty: 100 | Fill #1

## 2019-06-08 ENCOUNTER — Other Ambulatory Visit: Payer: Self-pay | Admitting: Internal Medicine

## 2019-06-08 MED FILL — ATENOLOL 50 MG TABLET: 50 | 30 days supply | Qty: 30 | Fill #2

## 2019-06-08 MED FILL — CARTIA XT 300 MG CAPSULE SA: 300 | 90 days supply | Qty: 90 | Fill #0

## 2019-06-08 MED FILL — SIMVASTATIN 20 MG TABLET: 20 | 90 days supply | Qty: 90 | Fill #0

## 2019-06-08 MED FILL — LOSARTAN POTASSIUM 100 MG T: 100 | 90 days supply | Qty: 90 | Fill #0

## 2019-06-08 MED FILL — HYDROCHLOROTHIAZIDE 12.5 MG: 12.5 | 90 days supply | Qty: 180 | Fill #0

## 2019-06-15 ENCOUNTER — Other Ambulatory Visit: Payer: Self-pay

## 2019-06-15 ENCOUNTER — Other Ambulatory Visit: Payer: Medicare PPO | Admitting: Internal Medicine

## 2019-06-15 DIAGNOSIS — E8881 Metabolic syndrome: Secondary | ICD-10-CM

## 2019-06-15 DIAGNOSIS — R7302 Impaired glucose tolerance (oral): Secondary | ICD-10-CM

## 2019-06-15 DIAGNOSIS — I10 Essential (primary) hypertension: Secondary | ICD-10-CM

## 2019-06-15 DIAGNOSIS — Z0000 Encounter for general adult medical examination without abnormal findings: Secondary | ICD-10-CM

## 2019-06-15 DIAGNOSIS — E109 Type 1 diabetes mellitus without complications: Secondary | ICD-10-CM

## 2019-06-16 ENCOUNTER — Encounter: Payer: BC Managed Care – PPO | Admitting: Internal Medicine

## 2019-06-16 LAB — CBC WITH DIFFERENTIAL/PLATELET
Absolute Monocytes: 510 cells/uL (ref 200–950)
Basophils Absolute: 41 cells/uL (ref 0–200)
Basophils Relative: 0.6 %
Eosinophils Absolute: 360 cells/uL (ref 15–500)
Eosinophils Relative: 5.3 %
HCT: 41.1 % (ref 35.0–45.0)
Hemoglobin: 13.6 g/dL (ref 11.7–15.5)
Lymphs Abs: 2768 cells/uL (ref 850–3900)
MCH: 29.2 pg (ref 27.0–33.0)
MCHC: 33.1 g/dL (ref 32.0–36.0)
MCV: 88.2 fL (ref 80.0–100.0)
MPV: 10.7 fL (ref 7.5–12.5)
Monocytes Relative: 7.5 %
Neutro Abs: 3121 cells/uL (ref 1500–7800)
Neutrophils Relative %: 45.9 %
Platelets: 263 10*3/uL (ref 140–400)
RBC: 4.66 10*6/uL (ref 3.80–5.10)
RDW: 13.1 % (ref 11.0–15.0)
Total Lymphocyte: 40.7 %
WBC: 6.8 10*3/uL (ref 3.8–10.8)

## 2019-06-16 LAB — HEMOGLOBIN A1C
Hgb A1c MFr Bld: 7.1 % of total Hgb — ABNORMAL HIGH (ref <5.7)
Mean Plasma Glucose: 157 (calc)
eAG (mmol/L): 8.7 (calc)

## 2019-06-16 LAB — COMPLETE METABOLIC PANEL WITH GFR
AG Ratio: 1.2 (calc) (ref 1.0–2.5)
ALT: 42 U/L — ABNORMAL HIGH (ref 6–29)
AST: 44 U/L — ABNORMAL HIGH (ref 10–35)
Albumin: 4.1 g/dL (ref 3.6–5.1)
Alkaline phosphatase (APISO): 51 U/L (ref 37–153)
BUN: 18 mg/dL (ref 7–25)
CO2: 22 mmol/L (ref 20–32)
Calcium: 9.9 mg/dL (ref 8.6–10.4)
Chloride: 100 mmol/L (ref 98–110)
Creat: 0.82 mg/dL (ref 0.50–0.99)
GFR, Est African American: 87 mL/min/{1.73_m2} (ref >=60)
GFR, Est Non African American: 75 mL/min/{1.73_m2} (ref >=60)
Globulin: 3.3 g/dL (calc) (ref 1.9–3.7)
Glucose, Bld: 139 mg/dL — ABNORMAL HIGH (ref 65–99)
Potassium: 4.4 mmol/L (ref 3.5–5.3)
Sodium: 139 mmol/L (ref 135–146)
Total Bilirubin: 0.4 mg/dL (ref 0.2–1.2)
Total Protein: 7.4 g/dL (ref 6.1–8.1)

## 2019-06-16 LAB — LIPID PANEL
Cholesterol: 155 mg/dL (ref <200)
HDL: 40 mg/dL — ABNORMAL LOW (ref >=50)
LDL Cholesterol (Calc): 92 mg/dL (calc)
Non-HDL Cholesterol (Calc): 115 mg/dL (calc) (ref <130)
Total CHOL/HDL Ratio: 3.9 (calc) (ref <5.0)
Triglycerides: 129 mg/dL (ref <150)

## 2019-06-16 LAB — TSH: TSH: 1.57 mIU/L (ref 0.40–4.50)

## 2019-06-20 ENCOUNTER — Ambulatory Visit: Payer: Medicare PPO | Admitting: Internal Medicine

## 2019-06-20 ENCOUNTER — Encounter: Payer: Self-pay | Admitting: Internal Medicine

## 2019-06-20 ENCOUNTER — Other Ambulatory Visit (HOSPITAL_COMMUNITY): Payer: Self-pay | Admitting: Internal Medicine

## 2019-06-20 ENCOUNTER — Other Ambulatory Visit: Payer: Self-pay

## 2019-06-20 VITALS — BP 130/88 | HR 72 | Temp 98.0°F | Ht 62.0 in | Wt 168.0 lb

## 2019-06-20 DIAGNOSIS — Z23 Encounter for immunization: Secondary | ICD-10-CM | POA: Diagnosis not present

## 2019-06-20 DIAGNOSIS — Z794 Long term (current) use of insulin: Secondary | ICD-10-CM | POA: Diagnosis not present

## 2019-06-20 DIAGNOSIS — E119 Type 2 diabetes mellitus without complications: Secondary | ICD-10-CM

## 2019-06-20 DIAGNOSIS — D369 Benign neoplasm, unspecified site: Secondary | ICD-10-CM

## 2019-06-20 DIAGNOSIS — Z0000 Encounter for general adult medical examination without abnormal findings: Secondary | ICD-10-CM

## 2019-06-20 DIAGNOSIS — Z96652 Presence of left artificial knee joint: Secondary | ICD-10-CM

## 2019-06-20 DIAGNOSIS — E8881 Metabolic syndrome: Secondary | ICD-10-CM

## 2019-06-20 DIAGNOSIS — I10 Essential (primary) hypertension: Secondary | ICD-10-CM

## 2019-06-20 DIAGNOSIS — M65341 Trigger finger, right ring finger: Secondary | ICD-10-CM | POA: Diagnosis not present

## 2019-06-20 DIAGNOSIS — Z6830 Body mass index (BMI) 30.0-30.9, adult: Secondary | ICD-10-CM | POA: Diagnosis not present

## 2019-06-20 LAB — POCT URINALYSIS DIPSTICK
Appearance: NEGATIVE
Bilirubin, UA: NEGATIVE
Blood, UA: NEGATIVE
Glucose, UA: NEGATIVE
Ketones, UA: NEGATIVE
Leukocytes, UA: NEGATIVE
Nitrite, UA: NEGATIVE
Odor: NEGATIVE
Protein, UA: NEGATIVE
Spec Grav, UA: 1.01 (ref 1.010–1.025)
Urobilinogen, UA: 0.2 E.U./dL
pH, UA: 6.5 (ref 5.0–8.0)

## 2019-06-20 MED ORDER — ACCU-CHEK SAFE-T PRO LANCETS MISC: 3 refills | Status: DC

## 2019-06-20 MED ORDER — BD PEN NEEDLE NANO U/F 32G X 4 MM MISC: 3 refills | Status: AC

## 2019-06-20 MED FILL — BD PEN NDL NANO 32GX5/32: 32G X 4 MM | 25 days supply | Qty: 100 | Fill #12

## 2019-06-20 MED FILL — ACCU-CHEK SOFTCLIX LANCETS: 25 days supply | Qty: 100 | Fill #2

## 2019-06-20 MED FILL — ACCU-CHEK GUIDE STRP: 25 days supply | Qty: 100 | Fill #2

## 2019-06-20 NOTE — Progress Notes (Signed)
Subjective:    Patient ID: Lisa Robertson, female    DOB: [birth year], 64 y.o.   MRN: [id number]  HPI  64 year old Female for Welcome to Medicare physical exam and evaluation of medical issues.  Having some insurance issues and may not have coverage in May. Will try to work with her on Rx refills.  She has a history of hypertension and insulin-dependent diabetes mellitus.  History of obesity.  Wears glasses and has annual diabetic eye exam.  Essential hypertension is stable.  Past medical history: History of tubal ligation.  History of total abdominal hysterectomy without oophorectomy for fibroids by Dr. Nori Riis in 1996/06/14.  History of hypertension since 06/14/1976.  Has been to diabetic educator at [facility] treatment center.  She is on statin therapy.  She will monitor her blood pressure at home.  History of left knee arthroplasty by Dr. Latanya Maudlin in June 14, 2017.  History of right fourth trigger finger referred to Dr. Latanya Maudlin  Social history: She is married.  2 sons and 1 daughter.  Does not smoke or consume alcohol.  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  Husband retired as an Archivist at [employer].  Family history: Son with history of kidney transplant.  Father with history of hypertension, prostate cancer and diabetes along with heart disease.  Mother died of a stroke in Jun 14, 2016 with history of diabetes and hypertension.  3 sisters and no brothers.    Review of Systems  Constitutional: Negative.   Respiratory: Negative.   Cardiovascular: Negative.   Gastrointestinal: Negative.   Genitourinary: Negative.   Neurological: Negative.   Psychiatric/Behavioral: Negative.        Objective:   Physical Exam Vitals reviewed.  Constitutional:      General: She is not in acute distress.    Appearance: Normal appearance.  HENT:     Head: Normocephalic and atraumatic.     Right Ear: Tympanic membrane normal.     Left Ear: Tympanic membrane normal.      Nose: Nose normal.     Mouth/Throat:     Mouth: Mucous membranes are moist.  Eyes:     General: No scleral icterus.       Right eye: No discharge.        Left eye: No discharge.     Extraocular Movements: Extraocular movements intact.     Pupils: Pupils are equal, round, and reactive to light.  Neck:     Vascular: No carotid bruit.     Comments: No thyromegaly Cardiovascular:     Rate and Rhythm: Normal rate and regular rhythm.     Pulses: Normal pulses.     Heart sounds: Normal heart sounds. No murmur.     Comments: Breast without masses. Pulmonary:     Effort: No respiratory distress.     Robertson sounds: Normal Robertson sounds. No wheezing or rales.  Abdominal:     General: Bowel sounds are normal. There is no distension.     Palpations: Abdomen is soft. There is no mass.     Tenderness: There is no abdominal tenderness. There is no guarding or rebound.  Genitourinary:    Comments: Pap deferred status post total abdominal hysterectomy and due to age.  Bimanual normal. Musculoskeletal:     Cervical back: Neck supple. No rigidity.     Right lower leg: No edema.     Left lower leg: No edema.  Lymphadenopathy:     Cervical: No cervical  adenopathy.  Skin:    General: Skin is warm and dry.  Neurological:     General: No focal deficit present.     Mental Status: She is alert and oriented to person, place, and time.     Cranial Nerves: No cranial nerve deficit.     Motor: No weakness.     Coordination: Coordination normal.  Psychiatric:        Mood and Affect: Mood normal.        Behavior: Behavior normal.        Thought Content: Thought content normal.        Judgment: Judgment normal.           Assessment & Plan:  Insulin-dependent diabetes mellitus-hemoglobin A1c excellent at 7.1%.  Diabetic foot exam is normal. Follow-up in 6 months.  Reminded about annual diabetic eye exam.   Hyperlipidemia: Lipid panel was normal except for HDL which is low at 40.  Recommend  more exercise.  Continue statin therapy.  Essential hypertension diagnosed in 1978.  Stable on current medication.  History of total abdominal hysterectomy without oophorectomy for fibroids in 1998.  Had tubal ligation in 1983.  Obesity-stable with BMI 30.73 and weight 168 pounds.  Continue to work on diet and exercise.  Has only gained 2 pounds during the pandemic which is excellent.  History of tubular adenoma-found on colonoscopy November 2019  Plan: Continue current medications and follow-up in 6 months.  Subjective:   Patient presents for Medicare Annual/Subsequent preventive examination.  Review Past Medical/Family/Social: See above  Risk Factors  Current exercise habits: Walks Dietary issues discussed: Low-fat low carbohydrate  Cardiac risk factors: Hyperlipidemia and family history  Depression Screen  (Note: if answer to either of the following is "Yes", a more complete depression screening is indicated)   Over the past two weeks, have you felt down, depressed or hopeless? No  Over the past two weeks, have you felt little interest or pleasure in doing things? No Have you lost interest or pleasure in daily life? No Do you often feel hopeless? No Do you cry easily over simple problems? No   Activities of Daily Living  In your present state of health, do you have any difficulty performing the following activities?:   Driving? No  Managing money? No  Feeding yourself? No  Getting from bed to chair? No  Climbing a flight of stairs? No  Preparing food and eating?: No  Bathing or showering? No  Getting dressed: No  Getting to the toilet? No  Using the toilet:No  Moving around from place to place: No  In the past year have you fallen or had a near fall?:No  Are you sexually active? yes Do you have more than one partner? No   Hearing Difficulties: No  Do you often ask people to speak up or repeat themselves? No  Do you experience ringing or noises in your ears? No   Do you have difficulty understanding soft or whispered voices? No  Do you feel that you have a problem with memory? No Do you often misplace items? No    Home Safety:  Do you have a smoke alarm at your residence? Yes Do you have grab bars in the bathroom?  None Do you have throw rugs in your house?  None   Cognitive Testing  Alert? Yes Normal Appearance?Yes  Oriented to person? Yes Place? Yes  Time? Yes  Recall of three objects? Yes  Can perform simple calculations? Yes  Displays  appropriate judgment?Yes  Can read the correct time from a watch face?Yes   List the Names of Other Physician/Practitioners you currently use:  See referral list for the physicians patient is currently seeing.     Review of Systems: See above   Objective:     General appearance: Appears stated age and mildly obese  Head: Normocephalic, without obvious abnormality, atraumatic  Eyes: conj clear, EOMi PEERLA  Ears: normal TM's and external ear canals both ears  Nose: Nares normal. Septum midline. Mucosa normal. No drainage or sinus tenderness.  Throat: lips, mucosa, and tongue normal; teeth and gums normal  Neck: no adenopathy, no carotid bruit, no JVD, supple, symmetrical, trachea midline and thyroid not enlarged, symmetric, no tenderness/mass/nodules  No CVA tenderness.  Lungs: clear to auscultation bilaterally  Breasts: normal appearance, no masses or tenderness, t Heart: regular rate and rhythm, S1, S2 normal, no murmur, click, rub or gallop  Abdomen: soft, non-tender; bowel sounds normal; no masses, no organomegaly  Musculoskeletal: ROM normal in all joints, no crepitus, no deformity, Normal muscle strengthen. Back  is symmetric, no curvature. Skin: Skin color, texture, turgor normal. No rashes or lesions  Lymph nodes: Cervical, supraclavicular, and axillary nodes normal.  Neurologic: CN 2 -12 Normal, Normal symmetric reflexes. Normal coordination and gait  Psych: Alert & Oriented x 3, Mood  appear stable.    Assessment:    Annual wellness medicare exam   Plan:    During the course of the visit the patient was educated and counseled about appropriate screening and preventive services including:   Annual mammogram  Patient should check with GI about when colonoscopy is due again.  Last done in 2019.     Patient Instructions (the written plan) was given to the patient.  Medicare Attestation  I have personally reviewed:  The patient's medical and social history  Their use of alcohol, tobacco or illicit drugs  Their current medications and supplements  The patient's functional ability including ADLs,fall risks, home safety risks, cognitive, and hearing and visual impairment  Diet and physical activities  Evidence for depression or mood disorders  The patient's weight, height, BMI, and visual acuity have been recorded in the chart. I have made referrals, counseling, and provided education to the patient based on review of the above and I have provided the patient with a written personalized care plan for preventive services.

## 2019-06-26 ENCOUNTER — Other Ambulatory Visit (HOSPITAL_COMMUNITY): Payer: Self-pay | Admitting: Internal Medicine

## 2019-06-26 MED FILL — ACCU-CHEK SOFTCLIX LANCETS: 75 days supply | Qty: 300 | Fill #0

## 2019-06-26 MED FILL — LEVEMIR FLEXTOUCH 100 UNITS: 100 | 90 days supply | Qty: 45 | Fill #0

## 2019-06-26 MED FILL — UNIFINE PENTIPS 32GX5/32: 32G X 4 MM | 90 days supply | Qty: 300 | Fill #0

## 2019-06-28 MED FILL — metFORMIN HCL 500 MG TABS: 500 | 90 days supply | Qty: 180 | Fill #3

## 2019-07-03 MED FILL — ATENOLOL 50 MG TABLET: 50 | 90 days supply | Qty: 90 | Fill #3

## 2019-07-04 MED FILL — NOVOLOG FLEXPEN SYRINGE: 100 | 90 days supply | Qty: 33 | Fill #0

## 2019-07-08 NOTE — Patient Instructions (Addendum)
It was a pleasure to see you today.  Continue current medications.  Continue to work on diet and exercise and follow-up here in 6 months.  No change in medications.  Prevnar 13 given.

## 2019-07-11 MED FILL — ACCU-CHEK GUIDE STRP: 90 days supply | Qty: 400 | Fill #0

## 2019-07-12 ENCOUNTER — Telehealth: Payer: Self-pay | Admitting: Internal Medicine

## 2019-07-12 ENCOUNTER — Ambulatory Visit: Payer: Medicare PPO | Admitting: Internal Medicine

## 2019-07-12 ENCOUNTER — Other Ambulatory Visit: Payer: Self-pay

## 2019-07-12 ENCOUNTER — Ambulatory Visit
Admission: RE | Admit: 2019-07-12 | Discharge: 2019-07-12 | Disposition: A | Discharge status: Home / Self Care | Payer: Medicare PPO | Source: Ambulatory Visit | Attending: Internal Medicine | Admitting: Internal Medicine

## 2019-07-12 ENCOUNTER — Encounter: Payer: Self-pay | Admitting: Internal Medicine

## 2019-07-12 VITALS — BP 170/100 | HR 80 | Temp 98.3°F | Ht 62.0 in | Wt 168.0 lb

## 2019-07-12 DIAGNOSIS — E119 Type 2 diabetes mellitus without complications: Secondary | ICD-10-CM

## 2019-07-12 DIAGNOSIS — R0781 Pleurodynia: Secondary | ICD-10-CM

## 2019-07-12 DIAGNOSIS — Z96652 Presence of left artificial knee joint: Secondary | ICD-10-CM

## 2019-07-12 DIAGNOSIS — Z794 Long term (current) use of insulin: Secondary | ICD-10-CM

## 2019-07-12 DIAGNOSIS — I10 Essential (primary) hypertension: Secondary | ICD-10-CM | POA: Diagnosis not present

## 2019-07-12 MED ORDER — HYDROCODONE-ACETAMINOPHEN 5-325 MG PO TABS: 1.0000 | ORAL_TABLET | Freq: Three times a day (TID) | ORAL | 0 refills | Status: DC | PRN

## 2019-07-12 MED FILL — HYDROCODON-APAP 5-325: 5-325 | 5 days supply | Qty: 15 | Fill #0

## 2019-07-12 NOTE — Progress Notes (Signed)
   Subjective:    Patient ID: Lisa Robertson, female    DOB: 30-Dec-1955, 64 y.o.   MRN: [id number]  HPI 64 year old Female for Welcome to Medicare physical exam and evaluation of medical issues June 20, 2019.  History of left knee arthroplasty in 2019.  History of essential hypertension and insulin-dependent diabetes mellitus.  She is on statin therapy.  On Sunday, April 25 she was putting on a pair of slacks.  She raised her right leg to the foot or leg into the right leg of the pants and felt pain that was sharp and disconcerting in her left rib cage area.  She is on Mobic for musculoskeletal pain.  Pain improved somewhat but states she had more acute pain with similar activity.  She was alarmed and called the office and we advised office visit.  She has not fallen or otherwise injured her left rib cage area that she is aware of.  Has no constipation, diarrhea, nausea, vomiting, fever, or chills.    Review of Systems see above     Objective:   Physical Exam Skin warm and dry.  Nodes none.  Blood pressure 170/100-patient is anxious pulse 80 temperature 98.3 degrees orally pulse oximetry 97% weight 168 pounds height 5 feet 2 inches BMI 30.73 skin warm and dry.  Nodes none.  Chest clear.  Cardiac exam regular rate and rhythm.  She is tender and left axillary line lowest palpable rib- there is point tenderness.       Assessment & Plan:  Left rib cage pain with no known fall or injury  Plan: Apply ice or heat to the left rib cage area.  Have left rib detail films done today.  Norco 5/325 (number 15 tablets) 1 p.o. every 8 hours as needed pain.  Continue Mobic 15 mg daily.

## 2019-07-12 NOTE — Telephone Encounter (Signed)
Lisa Robertson [phone]  [name] called to say on Sunday when she was putting on her pants that it felt like she pulled muscle. She took some pain medication and it felt better. Then yesterday when putting on her pants she had same pain come to left side and medication has not helped this time, she continues to have pain and is moving slow. Has had COVID Vaccines, 2nd one on 05/23/19, No COVID exposure, no fever or any other symptoms.

## 2019-07-12 NOTE — Telephone Encounter (Signed)
See today

## 2019-07-12 NOTE — Telephone Encounter (Signed)
Scheduled

## 2019-07-12 NOTE — Patient Instructions (Signed)
Left rib detail films done today.  Take Norco 5/325 sparingly every 8 hours as needed for pain.  Apply ice or heat to the left rib cage area.  Continue Mobic 15 mg daily.

## 2019-07-14 ENCOUNTER — Telehealth: Payer: Self-pay | Admitting: Internal Medicine

## 2019-07-14 MED ORDER — PREDNISONE 10 MG PO TABS: ORAL_TABLET | ORAL | 0 refills | Status: DC

## 2019-07-14 MED FILL — predniSONE 10 MG TABS: 10 | 4 days supply | Qty: 10 | Fill #0

## 2019-07-14 NOTE — Telephone Encounter (Signed)
[name] [phone]  Juanda Crumble called to say that below medication is making patient very nauseas and she has been throwing up since starting, is there something else for pain that she could get that will not make her so sick.  HYDROcodone-acetaminophen (NORCO) 5-325 MG tablet

## 2019-07-14 NOTE — Telephone Encounter (Signed)
This is the same med she had when her knee was bothering her. We can try something else.

## 2019-07-14 NOTE — Telephone Encounter (Signed)
Hold off on Prednisone

## 2019-07-14 NOTE — Telephone Encounter (Signed)
Call in Prednisone 10 mg to take 4 tabs day one followed by 3 tabs then 2 tabs then one tab. Glucose may go up a bit. If not improving next week will need to see orthopedist.

## 2019-07-14 NOTE — Telephone Encounter (Signed)
Patient was taking pain medication on empty stomach, this afternoon she ate first and it has done much better, should she still get the prednisone to take and then continue taking pain medicine after eating.

## 2019-07-14 NOTE — Telephone Encounter (Signed)
Called patient and let her know to hold off on prednisone for now! She verbalized understanding.

## 2019-09-04 DIAGNOSIS — H5213 Myopia, bilateral: Secondary | ICD-10-CM | POA: Diagnosis not present

## 2019-09-04 LAB — HM DIABETES EYE EXAM

## 2019-09-05 ENCOUNTER — Encounter: Payer: Self-pay | Admitting: Internal Medicine

## 2019-09-13 ENCOUNTER — Other Ambulatory Visit: Payer: Self-pay | Admitting: Internal Medicine

## 2019-09-13 DIAGNOSIS — Z1231 Encounter for screening mammogram for malignant neoplasm of breast: Secondary | ICD-10-CM

## 2019-09-20 ENCOUNTER — Other Ambulatory Visit: Payer: Self-pay

## 2019-09-20 ENCOUNTER — Ambulatory Visit
Admission: RE | Admit: 2019-09-20 | Discharge: 2019-09-20 | Disposition: A | Discharge status: Home / Self Care | Payer: Medicare HMO | Source: Ambulatory Visit | Attending: Internal Medicine | Admitting: Internal Medicine

## 2019-09-20 DIAGNOSIS — Z1231 Encounter for screening mammogram for malignant neoplasm of breast: Secondary | ICD-10-CM

## 2019-09-26 ENCOUNTER — Other Ambulatory Visit: Payer: Self-pay | Admitting: Internal Medicine

## 2019-09-26 MED FILL — MELOXICAM 15 MG TABLET: 15 | 90 days supply | Qty: 90 | Fill #1

## 2019-09-26 MED FILL — LOSARTAN POTASSIUM 100 MG T: 100 | 90 days supply | Qty: 90 | Fill #1

## 2019-09-26 MED FILL — METFORMIN HCL 500 MG TABS: 500 | 90 days supply | Qty: 180 | Fill #0

## 2019-09-26 MED FILL — ACCU-CHEK GUIDE STRP: 90 days supply | Qty: 400 | Fill #1

## 2019-09-26 MED FILL — ACCU-CHEK SOFTCLIX LANCETS: 75 days supply | Qty: 300 | Fill #1

## 2019-09-26 MED FILL — ATENOLOL 50 MG TABLET: 50 | 90 days supply | Qty: 90 | Fill #4

## 2019-09-26 MED FILL — HYDROCHLOROTHIAZIDE 12.5 MG: 12.5 | 90 days supply | Qty: 180 | Fill #1

## 2019-09-26 MED FILL — UNIFINE PENTIPS 32GX5/32: 32G X 4 MM | 90 days supply | Qty: 300 | Fill #1

## 2019-09-26 MED FILL — NOVOLOG FLEXPEN SYRINGE: 100 | 90 days supply | Qty: 33 | Fill #1

## 2019-09-26 MED FILL — CARTIA XT 300 MG CAPSULE SA: 300 | 90 days supply | Qty: 90 | Fill #1

## 2019-09-26 MED FILL — LEVEMIR FLEXTOUCH 100 UNITS: 100 | 90 days supply | Qty: 45 | Fill #1

## 2019-09-26 MED FILL — SIMVASTATIN 20 MG TABLET: 20 | 90 days supply | Qty: 90 | Fill #1

## 2019-11-09 MED FILL — AMOXICILLIN 500 MG CAPSULE: 500 | 2 days supply | Qty: 8 | Fill #0

## 2019-12-18 ENCOUNTER — Other Ambulatory Visit: Payer: Self-pay | Admitting: Internal Medicine

## 2019-12-18 ENCOUNTER — Other Ambulatory Visit: Payer: Self-pay

## 2019-12-18 ENCOUNTER — Other Ambulatory Visit: Payer: Medicare HMO | Admitting: Internal Medicine

## 2019-12-18 DIAGNOSIS — E119 Type 2 diabetes mellitus without complications: Secondary | ICD-10-CM | POA: Diagnosis not present

## 2019-12-18 DIAGNOSIS — E7849 Other hyperlipidemia: Secondary | ICD-10-CM

## 2019-12-18 MED FILL — SURE COMFORT PEN NDL 32GX5/: 32G X 4 MM | 90 days supply | Qty: 400 | Fill #0

## 2019-12-18 MED FILL — ATENOLOL 50 MG TABLET: 50 | 90 days supply | Qty: 90 | Fill #5

## 2019-12-18 MED FILL — CARTIA XT 300 MG CAPSULE SA: 300 | 90 days supply | Qty: 90 | Fill #2

## 2019-12-18 MED FILL — METFORMIN HCL 500 MG TABS: 500 | 90 days supply | Qty: 180 | Fill #1

## 2019-12-18 MED FILL — ACCU-CHEK GUIDE STRP: 90 days supply | Qty: 400 | Fill #2

## 2019-12-18 MED FILL — LEVEMIR FLEXTOUCH 100 UNITS: 100 | 90 days supply | Qty: 45 | Fill #2

## 2019-12-18 MED FILL — LOSARTAN POTASSIUM 100 MG T: 100 | 90 days supply | Qty: 90 | Fill #2

## 2019-12-18 MED FILL — NOVOLOG FLEXPEN SYRINGE: 100 | 90 days supply | Qty: 33 | Fill #2

## 2019-12-18 MED FILL — HYDROCHLOROTHIAZIDE 12.5 MG: 12.5 | 90 days supply | Qty: 180 | Fill #2

## 2019-12-18 MED FILL — SIMVASTATIN 20 MG TABLET: 20 | 90 days supply | Qty: 90 | Fill #2

## 2019-12-18 MED FILL — MELOXICAM 15 MG TABLET: 15 | 90 days supply | Qty: 90 | Fill #0

## 2019-12-19 LAB — LIPID PANEL
Cholesterol: 156 mg/dL (ref <200)
HDL: 41 mg/dL — ABNORMAL LOW (ref >=50)
LDL Cholesterol (Calc): 92 mg/dL (calc)
Non-HDL Cholesterol (Calc): 115 mg/dL (calc) (ref <130)
Total CHOL/HDL Ratio: 3.8 (calc) (ref <5.0)
Triglycerides: 135 mg/dL (ref <150)

## 2019-12-19 LAB — HEMOGLOBIN A1C
Hgb A1c MFr Bld: 6.8 % of total Hgb — ABNORMAL HIGH (ref <5.7)
Mean Plasma Glucose: 148 (calc)
eAG (mmol/L): 8.2 (calc)

## 2019-12-22 ENCOUNTER — Other Ambulatory Visit: Payer: Self-pay

## 2019-12-22 ENCOUNTER — Encounter: Payer: Self-pay | Admitting: Internal Medicine

## 2019-12-22 ENCOUNTER — Ambulatory Visit (INDEPENDENT_AMBULATORY_CARE_PROVIDER_SITE_OTHER): Payer: Medicare HMO | Admitting: Internal Medicine

## 2019-12-22 VITALS — BP 130/82 | HR 77 | Ht 62.0 in | Wt 168.0 lb

## 2019-12-22 DIAGNOSIS — Z23 Encounter for immunization: Secondary | ICD-10-CM | POA: Diagnosis not present

## 2019-12-22 DIAGNOSIS — I10 Essential (primary) hypertension: Secondary | ICD-10-CM

## 2019-12-22 DIAGNOSIS — E119 Type 2 diabetes mellitus without complications: Secondary | ICD-10-CM | POA: Diagnosis not present

## 2019-12-22 DIAGNOSIS — Z794 Long term (current) use of insulin: Secondary | ICD-10-CM | POA: Diagnosis not present

## 2019-12-22 NOTE — Progress Notes (Signed)
   Subjective:    Patient ID: Lisa Robertson, female    DOB: [birth year], 64 y.o.   MRN: [id number]  HPI  64 year old Female seen for 6 month follow up.  She has a history of essential hypertension, insulin-dependent diabetes mellitus, obesity, metabolic syndrome.  She has annual diabetic eye exam.  She is now retired as has her husband.  They have stayed well during the pandemic.  She does not smoke or consume alcohol.  She has had hypertension since 1978.  She is on statin therapy.  Her hemoglobin A1c is excellent at 6.8%.  It was 7.1% in April.  Total cholesterol is 156, HDL is low at 41, triglycerides normal at 135 and LDL cholesterol normal at 92.  This is excellent control of her lipids on simvastatin 20 mg daily.  With regard to diabetes she takes Levemir 50 units at 10 PM and NovoLog 12 units 3 times a day.  For hypertension she is on HCTZ 12.5 mg twice daily, atenolol 50 mg daily and losartan 100 mg daily.  Review of Systems see above-no new complaints     Objective:   Physical Exam Her blood pressure is excellent at 132/80.  Weight 168 pounds.  BMI 30.73.  She tries to watch her diet and gets regular exercise.  She has not gained any weight since April.  In March 2020 she weighed 166 pounds.  She is doing an excellent job of watching her weight.       Assessment & Plan:  Essential hypertension-excellent control on current regimen  Hyperlipidemia-excellent control on Zocor 20 mg daily  Type 2 diabetes mellitus-excellent control on insulin as noted above  Plan: She will continue current medications.  I am very pleased with her efforts.  She will return in 6 months for Medicare wellness, health maintenance exam and evaluation of medical issues.  She has had 2 COVID-19 immunizations.  Given flu vaccine today.  Has had Prevnar and pneumococcal 23 vaccines.

## 2020-01-13 NOTE — Patient Instructions (Signed)
It was a pleasure to see you today.  Blood pressure, lipids and diabetes are under excellent control on current medications.  Please continue the good work and follow-up here in 6 months.  Flu vaccine given today.  Have third Covid vaccine when available.

## 2020-03-25 ENCOUNTER — Other Ambulatory Visit: Payer: Self-pay | Admitting: Internal Medicine

## 2020-03-25 MED FILL — ATENOLOL 50 MG TABLET: 50 | 90 days supply | Qty: 90 | Fill #0

## 2020-03-25 MED FILL — NOVOLOG FLEXPEN SYRINGE: 100 | 90 days supply | Qty: 33 | Fill #3

## 2020-03-25 MED FILL — ACCU-CHEK GUIDE STRP: 90 days supply | Qty: 400 | Fill #3

## 2020-03-25 MED FILL — UNIFINE PENTIPS 32GX5/32: 32G X 4 MM | 90 days supply | Qty: 400 | Fill #1

## 2020-03-25 MED FILL — HYDROCHLOROTHIAZIDE 12.5 MG: 12.5 | 90 days supply | Qty: 180 | Fill #3

## 2020-03-25 MED FILL — LOSARTAN POTASSIUM 100 MG T: 100 | 30 days supply | Qty: 30 | Fill #3

## 2020-03-25 MED FILL — CARTIA XT 300 MG CAPSULE SA: 300 | 90 days supply | Qty: 90 | Fill #3

## 2020-03-25 MED FILL — ACCU-CHEK SOFTCLIX LANCETS: 25 days supply | Qty: 100 | Fill #3

## 2020-03-25 MED FILL — METFORMIN HCL 500 MG TABS: 500 | 90 days supply | Qty: 180 | Fill #2

## 2020-03-25 MED FILL — LEVEMIR FLEXTOUCH 100 UNITS: 100 | 90 days supply | Qty: 45 | Fill #3

## 2020-03-25 MED FILL — SIMVASTATIN 20 MG TABLET: 20 | 90 days supply | Qty: 90 | Fill #3

## 2020-04-23 MED FILL — ACCU-CHEK SOFTCLIX LANCETS: 75 days supply | Qty: 300 | Fill #2

## 2020-04-23 MED FILL — LOSARTAN POTASSIUM 100 MG T: 100 | 30 days supply | Qty: 30 | Fill #4

## 2020-05-20 MED FILL — LOSARTAN POTASSIUM 100 MG T: 100 | 30 days supply | Qty: 30 | Fill #5

## 2020-05-27 ENCOUNTER — Telehealth: Payer: Self-pay | Admitting: Internal Medicine

## 2020-05-27 NOTE — Telephone Encounter (Signed)
Called patient back and she does have blood sugar readings, so I scheduled an appointment.

## 2020-05-27 NOTE — Telephone Encounter (Signed)
!   Will need to see several readings before breakfast, before lunch, and before supper and at bedtime. If she has these, then we can see her tomorrow-- otherwise we need this info first.

## 2020-05-27 NOTE — Telephone Encounter (Addendum)
Lisa Robertson [phone]  Rian called to say for the last week, she eats a good supper and a snack and her blood sugar has been below 100 around 10:30 before she goes to bed, that concerns her for it to be that low.  She is also starting to have some right heel pain sometime. Would like to be seen tomorrow.

## 2020-05-28 ENCOUNTER — Encounter: Payer: Self-pay | Admitting: Internal Medicine

## 2020-05-28 ENCOUNTER — Ambulatory Visit (INDEPENDENT_AMBULATORY_CARE_PROVIDER_SITE_OTHER): Payer: Medicare HMO | Admitting: Internal Medicine

## 2020-05-28 ENCOUNTER — Other Ambulatory Visit: Payer: Self-pay

## 2020-05-28 VITALS — BP 130/82 | HR 70 | Ht 62.0 in | Wt 166.0 lb

## 2020-05-28 DIAGNOSIS — M79672 Pain in left foot: Secondary | ICD-10-CM

## 2020-05-28 DIAGNOSIS — E119 Type 2 diabetes mellitus without complications: Secondary | ICD-10-CM | POA: Diagnosis not present

## 2020-05-28 DIAGNOSIS — Z794 Long term (current) use of insulin: Secondary | ICD-10-CM | POA: Diagnosis not present

## 2020-05-28 NOTE — Patient Instructions (Addendum)
Decrease Novolog from 12 units to 10 units 3 times a day to prevent low blood sugars. Follow up at [facility].  May take meloxicam as needed for left heel pain.

## 2020-05-28 NOTE — Progress Notes (Signed)
   Subjective:    Patient ID: Lisa Robertson, female    DOB: [birth year], 65 y.o.   MRN: [id number]  HPI 65 year old Female retired with hx of IDDM and HTN seen for left heel pain and low accucheck readings.  She called saying that for the last week she had had Accu-Chek readings below 100 around 10:30 PM before bedtime and that was concerning to her.  Her husband recently had knee replacement surgery and she has been busy trying to take care of him because he has limited mobility.  She has been on her feet a lot.  Does not feel that her food intake is changed but she has been more physically active than usual.  She is a bit tired.  Has had some pain left heel plantar aspect recently but has been on her feet more with her husband recovering from surgery.  Denies any injury to her heel.    Review of Systems see above     Objective:   Physical Exam  Blood pressure is 130/82 pulse 70 regular pulse oximetry 98% weight 166 pounds BMI 30.636.  Skin is warm and dry.  No thyromegaly.  No carotid bruits.  Chest is clear to auscultation.  Cardiac exam: Regular rate and rhythm.  No lower extremity pitting edema.  Plantar aspect of right heel is minimally tender to deep palpation.  No lesions noted on heel.      Assessment & Plan:  Intermittent hypoglycemia-could be due to more physical activity or less caloric intake since husband's recent knee replacement surgery.  Had talked with her about this.  She is worried about becoming hypoglycemic in her sleep.  For now, we will decrease NovoLog to 10 units subcutaneously 3 times a day.  She remains on Levemir 50 units at 10 PM.  She also takes Metformin 500 mg twice daily.  Left heel pain- she has meloxicam to take for heel pain if necessary.  Follow-up April 8 when she has Medicare wellness visit.

## 2020-06-14 ENCOUNTER — Other Ambulatory Visit: Payer: Self-pay

## 2020-06-14 ENCOUNTER — Other Ambulatory Visit: Payer: Medicare HMO | Admitting: Internal Medicine

## 2020-06-14 DIAGNOSIS — Z6830 Body mass index (BMI) 30.0-30.9, adult: Secondary | ICD-10-CM

## 2020-06-14 DIAGNOSIS — Z794 Long term (current) use of insulin: Secondary | ICD-10-CM

## 2020-06-14 DIAGNOSIS — E119 Type 2 diabetes mellitus without complications: Secondary | ICD-10-CM

## 2020-06-14 DIAGNOSIS — Z0000 Encounter for general adult medical examination without abnormal findings: Secondary | ICD-10-CM | POA: Diagnosis not present

## 2020-06-14 DIAGNOSIS — E8881 Metabolic syndrome: Secondary | ICD-10-CM | POA: Diagnosis not present

## 2020-06-14 DIAGNOSIS — I10 Essential (primary) hypertension: Secondary | ICD-10-CM | POA: Diagnosis not present

## 2020-06-15 LAB — URINALYSIS, MICROSCOPIC ONLY
Bacteria, UA: NONE SEEN /HPF
RBC / HPF: NONE SEEN /HPF (ref 0–2)
WBC, UA: NONE SEEN /HPF (ref 0–5)

## 2020-06-15 LAB — CBC WITH DIFFERENTIAL/PLATELET
Absolute Monocytes: 580 cells/uL (ref 200–950)
Basophils Absolute: 48 cells/uL (ref 0–200)
Basophils Relative: 0.7 %
Eosinophils Absolute: 352 cells/uL (ref 15–500)
Eosinophils Relative: 5.1 %
HCT: 42 % (ref 35.0–45.0)
Hemoglobin: 13.5 g/dL (ref 11.7–15.5)
Lymphs Abs: 3002 cells/uL (ref 850–3900)
MCH: 28 pg (ref 27.0–33.0)
MCHC: 32.1 g/dL (ref 32.0–36.0)
MCV: 87.1 fL (ref 80.0–100.0)
MPV: 10.1 fL (ref 7.5–12.5)
Monocytes Relative: 8.4 %
Neutro Abs: 2919 cells/uL (ref 1500–7800)
Neutrophils Relative %: 42.3 %
Platelets: 290 10*3/uL (ref 140–400)
RBC: 4.82 10*6/uL (ref 3.80–5.10)
RDW: 13.5 % (ref 11.0–15.0)
Total Lymphocyte: 43.5 %
WBC: 6.9 10*3/uL (ref 3.8–10.8)

## 2020-06-15 LAB — COMPLETE METABOLIC PANEL WITH GFR
AG Ratio: 1.2 (calc) (ref 1.0–2.5)
ALT: 29 U/L (ref 6–29)
AST: 27 U/L (ref 10–35)
Albumin: 4.2 g/dL (ref 3.6–5.1)
Alkaline phosphatase (APISO): 51 U/L (ref 37–153)
BUN: 18 mg/dL (ref 7–25)
CO2: 24 mmol/L (ref 20–32)
Calcium: 10 mg/dL (ref 8.6–10.4)
Chloride: 102 mmol/L (ref 98–110)
Creat: 0.73 mg/dL (ref 0.50–0.99)
GFR, Est African American: 99 mL/min/{1.73_m2} (ref >=60)
GFR, Est Non African American: 86 mL/min/{1.73_m2} (ref >=60)
Globulin: 3.5 g/dL (calc) (ref 1.9–3.7)
Glucose, Bld: 149 mg/dL — ABNORMAL HIGH (ref 65–99)
Potassium: 4.4 mmol/L (ref 3.5–5.3)
Sodium: 139 mmol/L (ref 135–146)
Total Bilirubin: 0.4 mg/dL (ref 0.2–1.2)
Total Protein: 7.7 g/dL (ref 6.1–8.1)

## 2020-06-15 LAB — LIPID PANEL
Cholesterol: 172 mg/dL (ref <200)
HDL: 47 mg/dL — ABNORMAL LOW (ref >=50)
LDL Cholesterol (Calc): 105 mg/dL (calc) — ABNORMAL HIGH
Non-HDL Cholesterol (Calc): 125 mg/dL (calc) (ref <130)
Total CHOL/HDL Ratio: 3.7 (calc) (ref <5.0)
Triglycerides: 103 mg/dL (ref <150)

## 2020-06-15 LAB — HEMOGLOBIN A1C
Hgb A1c MFr Bld: 6.6 % of total Hgb — ABNORMAL HIGH (ref <5.7)
Mean Plasma Glucose: 143 mg/dL
eAG (mmol/L): 7.9 mmol/L

## 2020-06-21 ENCOUNTER — Encounter: Payer: Self-pay | Admitting: Internal Medicine

## 2020-06-21 ENCOUNTER — Ambulatory Visit (INDEPENDENT_AMBULATORY_CARE_PROVIDER_SITE_OTHER): Payer: Medicare HMO | Admitting: Internal Medicine

## 2020-06-21 ENCOUNTER — Other Ambulatory Visit: Payer: Self-pay

## 2020-06-21 VITALS — BP 130/80 | HR 75 | Ht 62.0 in | Wt 168.0 lb

## 2020-06-21 DIAGNOSIS — I10 Essential (primary) hypertension: Secondary | ICD-10-CM | POA: Diagnosis not present

## 2020-06-21 DIAGNOSIS — M79671 Pain in right foot: Secondary | ICD-10-CM | POA: Diagnosis not present

## 2020-06-21 DIAGNOSIS — Z0000 Encounter for general adult medical examination without abnormal findings: Secondary | ICD-10-CM

## 2020-06-21 DIAGNOSIS — E8881 Metabolic syndrome: Secondary | ICD-10-CM | POA: Diagnosis not present

## 2020-06-21 DIAGNOSIS — Z794 Long term (current) use of insulin: Secondary | ICD-10-CM | POA: Diagnosis not present

## 2020-06-21 DIAGNOSIS — Z96652 Presence of left artificial knee joint: Secondary | ICD-10-CM | POA: Diagnosis not present

## 2020-06-21 DIAGNOSIS — Z6830 Body mass index (BMI) 30.0-30.9, adult: Secondary | ICD-10-CM

## 2020-06-21 DIAGNOSIS — M722 Plantar fascial fibromatosis: Secondary | ICD-10-CM | POA: Diagnosis not present

## 2020-06-21 DIAGNOSIS — E119 Type 2 diabetes mellitus without complications: Secondary | ICD-10-CM | POA: Diagnosis not present

## 2020-06-21 LAB — POCT URINALYSIS DIPSTICK
Appearance: NEGATIVE
Bilirubin, UA: NEGATIVE
Blood, UA: NEGATIVE
Glucose, UA: NEGATIVE
Ketones, UA: NEGATIVE
Leukocytes, UA: NEGATIVE
Nitrite, UA: NEGATIVE
Odor: NEGATIVE
Protein, UA: NEGATIVE
Spec Grav, UA: 1.01 (ref 1.010–1.025)
Urobilinogen, UA: 0.2 E.U./dL
pH, UA: 7.5 (ref 5.0–8.0)

## 2020-06-21 MED ORDER — METHYLPREDNISOLONE ACETATE 80 MG/ML IJ SUSP
80.0000 mg | Freq: Once | INTRAMUSCULAR | Status: AC
  Administered 2020-06-21: 80 mg via INTRAMUSCULAR

## 2020-06-21 NOTE — Progress Notes (Signed)
Subjective:    Patient ID: Lisa Robertson, female    DOB: 12/14/1955, 65 y.o.   MRN: [id number]  HPI 65 year old Female seen for health maintenance exam and evaluation of medical issues.  History of essential hypertension blood pressure under good control with atenolol.  She has insulin-dependent diabetes which is under excellent control.  She feels well except for plantar fasciitis right foot and will be given injection of Depo-Medrol and right heel today.  History of obesity and watches her diet carefully.  Wears glasses and has annual diabetic eye exam.  Past medical history: History of tubal ligation.  History of total abdominal hysterectomy without oophorectomy for fibroids by Dr. Nori Riis in 1996-06-24.  History of hypertension since 06-24-1976.  Has been to diabetic educator at diabetes treatment center.  She is on statin therapy.  She monitors her blood pressure at home frequently.  History of left knee arthroplasty by Dr. Rhona Raider in Jun 24, 2017.  History of right fourth trigger finger seen by Dr. Rhona Raider.  Social history: She is married.  2 sons and 1 daughter.  Does not smoke or consume alcohol.  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  Husband retired as an Archivist at [employer]  Family history: Son with history of kidney transplant.  Father with history of hypertension, prostate cancer and diabetes along with heart disease.  Mother died of a stroke in Jun 24, 2016 with history of diabetes and hypertension.  3 sisters and no brothers.      Review of Systems no new complaints     Objective:   Physical Exam Weight 168 pounds, blood pressure 130/80 pulse 75 pulse oximetry 97% BMI 30.73  Skin: Warm and dry.  Nodes none.  TMs clear.  Pharynx is clear.  Neck is supple without JVD thyromegaly or carotid bruits.  Chest is clear to auscultation.  Cardiac exam regular rate and rhythm normal S1 and S2 without murmurs or gallops.  Abdomen is soft  nondistended without hepatosplenomegaly masses or tenderness.  Bimanual exam is normal.  Pap smear not done due to age.  No lower extremity pitting edema.  Affect thought and judgment are normal.  No focal deficits on brief neurological exam.       Assessment & Plan:  Type 2 diabetes mellitus under excellent control.  Hemoglobin A1c stable at 6.6% and improved from 6.8% in October.  Fasting glucose is 149.  Essential hypertension-stable on atenolol.  Was diagnosed in 06-24-1976 and is stable.  Mild hyperlipidemia-she is on statin therapy due to diabetes.  Generally lipids are within normal limits.  LDL this year is 105 and was 92 in October 2021.  It was 108 in 25-Jun-2018 and 99 in September 2019.  Continue diet and exercise efforts and current dose of lipid-lowering medication.  Total cholesterol is normal at 172.  Right plantar fasciitis treated with an injection of Depo-Medrol 80 mg per 1 cc 1 cc given today subcutaneously into the right heel.  Family history of kidney disease in son.  Patient's BUN and creatinine are entirely normal.  Status post left knee arthroplasty by Dr. Rhona Raider in 06/24/17.  History of low HDL.  Currently at 73 and has been as low as 40 in April 2021.  History of tubular adenoma on colonoscopy November 2019.  Colonoscopy due in June 25, 2022.  History of total abdominal hysterectomy without oophorectomy for fibroids in 06/24/1996.  Plan: Return in 6 months or as needed.  Continue current medications.  Continue with diet and exercise.  Subjective:   Patient presents for Medicare Annual/Subsequent preventive examination.  Review Past Medical/Family/Social: See above   Risk Factors  Current exercise habits: Walks Dietary issues discussed: Low-fat low carbohydrate  Cardiac risk factors: Diabetes  Depression Screen  (Note: if answer to either of the following is "Yes", a more complete depression screening is indicated)   Over the past two weeks, have you felt down, depressed or  hopeless? No  Over the past two weeks, have you felt little interest or pleasure in doing things? No Have you lost interest or pleasure in daily life? No Do you often feel hopeless? No Do you cry easily over simple problems? No   Activities of Daily Living  In your present state of health, do you have any difficulty performing the following activities?:   Driving? No  Managing money? No  Feeding yourself? No  Getting from bed to chair? No  Climbing a flight of stairs? No  Preparing food and eating?: No  Bathing or showering? No  Getting dressed: No  Getting to the toilet? No  Using the toilet:No  Moving around from place to place: No  In the past year have you fallen or had a near fall?:No  Are you sexually active? yes Do you have more than one partner? No   Hearing Difficulties: No  Do you often ask people to speak up or repeat themselves? No  Do you experience ringing or noises in your ears? No  Do you have difficulty understanding soft or whispered voices? No  Do you feel that you have a problem with memory? No Do you often misplace items? No    Home Safety:  Do you have a smoke alarm at your residence? Yes Do you have grab bars in the bathroom?  No Do you have throw rugs in your house?  Yes   Cognitive Testing  Alert? Yes Normal Appearance?Yes  Oriented to person? Yes Place? Yes  Time? Yes  Recall of three objects? Yes  Can perform simple calculations? Yes  Displays appropriate judgment?Yes  Can read the correct time from a watch face?Yes   List the Names of Other Physician/Practitioners you currently use:  See referral list for the physicians patient is currently seeing.     Review of Systems: See above   Objective:     General appearance: Appears stated age and mildly obese  Head: Normocephalic, without obvious abnormality, atraumatic  Eyes: conj clear, EOMi PEERLA  Ears: normal TM's and external ear canals both ears  Nose: Nares normal. Septum  midline. Mucosa normal. No drainage or sinus tenderness.  Throat: lips, mucosa, and tongue normal; teeth and gums normal  Neck: no adenopathy, no carotid bruit, no JVD, supple, symmetrical, trachea midline and thyroid not enlarged, symmetric, no tenderness/mass/nodules  No CVA tenderness.  Lungs: clear to auscultation bilaterally  Breasts: normal appearance, no masses or tenderness Heart: regular rate and rhythm, S1, S2 normal, no murmur, click, rub or gallop  Abdomen: soft, non-tender; bowel sounds normal; no masses, no organomegaly  Musculoskeletal: ROM normal in all joints, no crepitus, no deformity, Normal muscle strengthen. Back  is symmetric, no curvature. Skin: Skin color, texture, turgor normal. No rashes or lesions  Lymph nodes: Cervical, supraclavicular, and axillary nodes normal.  Neurologic: CN 2 -12 Normal, Normal symmetric reflexes. Normal coordination and gait  Psych: Alert & Oriented x 3, Mood appear stable.    Assessment:    Annual wellness medicare exam   Plan:  During the course of the visit the patient was educated and counseled about appropriate screening and preventive services including:   See above-colonoscopy is up-to-date  Has had COVID vaccines  Gets annual flu vaccine  Has had Shingrix vaccine and pneumococcal vaccines  Mammogram due in July 2022      Patient Instructions (the written plan) was given to the patient.  Medicare Attestation  I have personally reviewed:  The patient's medical and social history  Their use of alcohol, tobacco or illicit drugs  Their current medications and supplements  The patient's functional ability including ADLs,fall risks, home safety risks, cognitive, and hearing and visual impairment  Diet and physical activities  Evidence for depression or mood disorders  The patient's weight, height, BMI, and visual acuity have been recorded in the chart. I have made referrals, counseling, and provided education to the  patient based on review of the above and I have provided the patient with a written personalized care plan for preventive services.

## 2020-06-21 NOTE — Patient Instructions (Addendum)
Injection of Depo-Medrol Marcaine and Xylocaine given in right heel for plantar fasciitis.  It was a pleasure to see you today.  Continue current medications and follow-up in 6 months.  Have mammogram in July.

## 2020-06-22 LAB — MICROALBUMIN / CREATININE URINE RATIO
Creatinine, Urine: 61 mg/dL (ref 20–275)
Microalb Creat Ratio: 10 mcg/mg creat (ref <30)
Microalb, Ur: 0.6 mg/dL

## 2020-06-24 ENCOUNTER — Other Ambulatory Visit (HOSPITAL_COMMUNITY): Payer: Self-pay

## 2020-06-24 ENCOUNTER — Other Ambulatory Visit: Payer: Self-pay | Admitting: Internal Medicine

## 2020-06-24 MED ORDER — ATENOLOL 50 MG PO TABS
50.0000 mg | ORAL_TABLET | Freq: Every day | ORAL | 3 refills | Status: DC
  Filled 2020-06-24: qty 90, 90d supply, fill #0
  Filled 2020-09-23: qty 90, 90d supply, fill #1
  Filled 2020-12-23: qty 90, 90d supply, fill #2
  Filled 2021-03-25: qty 90, 90d supply, fill #3

## 2020-06-24 MED ORDER — UNIFINE PENTIPS 32G X 4 MM MISC
3 refills | Status: DC
  Filled 2020-06-24: qty 400, 90d supply, fill #0
  Filled 2020-09-23: qty 400, 90d supply, fill #1
  Filled 2020-12-23: qty 400, 90d supply, fill #2
  Filled 2021-03-25: qty 400, 90d supply, fill #3

## 2020-06-24 MED ORDER — LEVEMIR FLEXTOUCH 100 UNIT/ML ~~LOC~~ SOPN
50.0000 [IU] | PEN_INJECTOR | Freq: Every day | SUBCUTANEOUS | 3 refills | Status: DC
  Filled 2020-06-24: qty 45, 90d supply, fill #0
  Filled 2020-09-23: qty 45, 90d supply, fill #1
  Filled 2020-12-23: qty 45, 90d supply, fill #2
  Filled 2021-03-25: qty 45, 90d supply, fill #3

## 2020-06-24 MED ORDER — METFORMIN HCL 500 MG PO TABS
500.0000 mg | ORAL_TABLET | Freq: Two times a day (BID) | ORAL | 3 refills | Status: DC
  Filled 2020-06-24: qty 180, 90d supply, fill #0
  Filled 2020-09-23: qty 180, 90d supply, fill #1
  Filled 2020-12-23: qty 180, 90d supply, fill #2
  Filled 2021-03-25: qty 180, 90d supply, fill #3

## 2020-06-24 MED ORDER — ACCU-CHEK GUIDE VI STRP
ORAL_STRIP | 3 refills | Status: DC
  Filled 2020-06-24: qty 400, 90d supply, fill #0
  Filled 2020-09-23: qty 400, 90d supply, fill #1
  Filled 2020-12-23: qty 400, 90d supply, fill #2

## 2020-06-24 MED ORDER — DILTIAZEM HCL ER COATED BEADS 300 MG PO CP24
300.0000 mg | ORAL_CAPSULE | Freq: Every day | ORAL | 3 refills | Status: DC
  Filled 2020-06-24: qty 90, 90d supply, fill #0
  Filled 2020-09-23: qty 90, 90d supply, fill #1
  Filled 2020-12-23: qty 90, 90d supply, fill #2
  Filled 2021-03-25: qty 90, 90d supply, fill #3

## 2020-06-24 MED ORDER — SIMVASTATIN 20 MG PO TABS
20.0000 mg | ORAL_TABLET | Freq: Every evening | ORAL | 3 refills | Status: DC
  Filled 2020-06-24: qty 90, 90d supply, fill #0
  Filled 2020-09-23: qty 90, 90d supply, fill #1
  Filled 2020-12-23: qty 90, 90d supply, fill #2
  Filled 2021-03-25: qty 90, 90d supply, fill #3

## 2020-06-24 MED ORDER — HYDROCHLOROTHIAZIDE 12.5 MG PO CAPS
12.5000 mg | ORAL_CAPSULE | Freq: Two times a day (BID) | ORAL | 3 refills | Status: DC
  Filled 2020-06-24: qty 180, 90d supply, fill #0
  Filled 2020-09-23: qty 180, 90d supply, fill #1
  Filled 2020-12-23: qty 180, 90d supply, fill #2
  Filled 2021-03-25: qty 180, 90d supply, fill #3

## 2020-06-24 MED ORDER — NOVOLOG FLEXPEN 100 UNIT/ML ~~LOC~~ SOPN
12.0000 [IU] | PEN_INJECTOR | Freq: Three times a day (TID) | SUBCUTANEOUS | 3 refills | Status: DC
  Filled 2020-06-24: qty 30, 84d supply, fill #0
  Filled 2020-09-23: qty 30, 84d supply, fill #1
  Filled 2020-12-23: qty 30, 84d supply, fill #2
  Filled 2021-03-25: qty 30, 84d supply, fill #3

## 2020-06-26 ENCOUNTER — Other Ambulatory Visit (HOSPITAL_COMMUNITY): Payer: Self-pay

## 2020-06-28 ENCOUNTER — Other Ambulatory Visit (HOSPITAL_COMMUNITY): Payer: Self-pay

## 2020-06-28 ENCOUNTER — Other Ambulatory Visit: Payer: Self-pay | Admitting: Internal Medicine

## 2020-06-28 MED FILL — Meloxicam Tab 15 MG: ORAL | 90 days supply | Qty: 90 | Fill #0 | Status: AC

## 2020-06-29 MED ORDER — LOSARTAN POTASSIUM 100 MG PO TABS
100.0000 mg | ORAL_TABLET | Freq: Every day | ORAL | 3 refills | Status: DC
  Filled 2020-06-29: qty 90, 90d supply, fill #0
  Filled 2020-09-23: qty 90, 90d supply, fill #1
  Filled 2020-12-23: qty 90, 90d supply, fill #2
  Filled 2021-03-25: qty 90, 90d supply, fill #3

## 2020-07-01 ENCOUNTER — Other Ambulatory Visit (HOSPITAL_COMMUNITY): Payer: Self-pay

## 2020-07-31 ENCOUNTER — Other Ambulatory Visit (HOSPITAL_COMMUNITY): Payer: Self-pay

## 2020-07-31 ENCOUNTER — Telehealth: Payer: Self-pay | Admitting: Internal Medicine

## 2020-07-31 ENCOUNTER — Encounter: Payer: Self-pay | Admitting: Internal Medicine

## 2020-07-31 MED ORDER — HYDROCODONE BIT-HOMATROP MBR 5-1.5 MG/5ML PO SOLN
5.0000 mL | Freq: Three times a day (TID) | ORAL | 0 refills | Status: DC | PRN
  Filled 2020-07-31: qty 120, 8d supply, fill #0

## 2020-07-31 NOTE — Telephone Encounter (Signed)
Husband called. Patient has respiratory infection symptoms. Has positive home test for Covid. Will call in cough medication for her, Hycodan, one tsp every 8-12 hours as needed for cough. She will be seen tomorrow. Is a candidate for Paxlovid.

## 2020-08-01 ENCOUNTER — Ambulatory Visit (INDEPENDENT_AMBULATORY_CARE_PROVIDER_SITE_OTHER): Payer: Medicare HMO | Admitting: Internal Medicine

## 2020-08-01 ENCOUNTER — Other Ambulatory Visit: Payer: Self-pay

## 2020-08-01 ENCOUNTER — Other Ambulatory Visit (HOSPITAL_COMMUNITY): Payer: Self-pay

## 2020-08-01 ENCOUNTER — Encounter: Payer: Self-pay | Admitting: Internal Medicine

## 2020-08-01 VITALS — HR 88 | Temp 98.5°F

## 2020-08-01 DIAGNOSIS — U071 COVID-19: Secondary | ICD-10-CM

## 2020-08-01 MED ORDER — ONDANSETRON HCL 4 MG PO TABS
4.0000 mg | ORAL_TABLET | Freq: Three times a day (TID) | ORAL | 0 refills | Status: DC | PRN
  Filled 2020-08-01: qty 30, 10d supply, fill #0

## 2020-08-01 MED ORDER — NIRMATRELVIR/RITONAVIR (PAXLOVID)TABLET
ORAL_TABLET | ORAL | 0 refills | Status: DC
  Filled 2020-08-01: qty 30, 5d supply, fill #0

## 2020-08-01 NOTE — Patient Instructions (Addendum)
Take Paxlovid  Twice daily as directed. Zofran 4 mg up to 3 times daily as needed for nausea. Stay well hydrated and walk some. Monitor oxygen levels with home oxygen monitor.

## 2020-08-01 NOTE — Progress Notes (Signed)
   Subjective:    Patient ID: Lisa Robertson, female    DOB: Nov 07, 1955, 65 y.o.   MRN: [id number]  HPI 65 year old Female with insulin-dependent diabetes mellitus, hypertension, hyperlipidemia, mild obesity seen for positive home COVID-19 test.  She went to visit her daughter out of town and apparently there was a COVID exposure.  She has nausea.  Has not vomited but just felt very nauseated today.  This is a new symptom.  Onset of symptoms yesterday.  A complete metabolic panel drawn in April showed normal renal functions.  Patient has been coughing.  Has malaise and fatigue.  Moves slowly.      Review of Systems denies sore throat, dysgeusia, shaking chills     Objective:   Physical Exam Temperature 98.5 degrees pulse 88 pulse oximetry 95%  Skin warm and dry.  Pharynx slightly injected.  TMs clear.  Chest is clear to auscultation without rales or wheezing presently       Assessment & Plan:  Acute COVID-19 virus infection  Type 2 diabetes mellitus  Hypertension  Hyperlipidemia  Plan:- Paxlovid 300 nirmatrelir and ritonavir 100 mg twice daily for 5 days.  Zofran 4 mg up to 3 times daily if needed for nausea.  Stay well-hydrated.  Walk some.  Monitor pulse oximetry.  Call if symptoms worsen.

## 2020-08-02 LAB — SARS-COV-2 RNA,(COVID-19) QUALITATIVE NAAT: SARS CoV2 RNA: DETECTED — AB

## 2020-08-05 ENCOUNTER — Other Ambulatory Visit (HOSPITAL_COMMUNITY): Payer: Self-pay

## 2020-08-05 ENCOUNTER — Telehealth: Payer: Self-pay | Admitting: Internal Medicine

## 2020-08-05 NOTE — Telephone Encounter (Signed)
Faxed Positive Home COVID Results to [facility] [phone]

## 2020-09-23 ENCOUNTER — Other Ambulatory Visit (HOSPITAL_COMMUNITY): Payer: Self-pay

## 2020-09-26 ENCOUNTER — Other Ambulatory Visit: Payer: Self-pay | Admitting: Internal Medicine

## 2020-09-26 DIAGNOSIS — Z1231 Encounter for screening mammogram for malignant neoplasm of breast: Secondary | ICD-10-CM

## 2020-11-20 ENCOUNTER — Ambulatory Visit
Admission: RE | Admit: 2020-11-20 | Discharge: 2020-11-20 | Disposition: A | Discharge status: Home / Self Care | Payer: Medicare HMO | Source: Ambulatory Visit | Attending: Internal Medicine | Admitting: Internal Medicine

## 2020-11-20 ENCOUNTER — Other Ambulatory Visit: Payer: Self-pay

## 2020-11-20 DIAGNOSIS — Z1231 Encounter for screening mammogram for malignant neoplasm of breast: Secondary | ICD-10-CM | POA: Diagnosis not present

## 2020-11-26 ENCOUNTER — Other Ambulatory Visit (HOSPITAL_COMMUNITY): Payer: Self-pay

## 2020-12-09 ENCOUNTER — Other Ambulatory Visit (HOSPITAL_COMMUNITY): Payer: Self-pay

## 2020-12-09 MED ORDER — AMOXICILLIN 500 MG PO CAPS
2000.0000 mg | ORAL_CAPSULE | ORAL | 0 refills | Status: DC
  Filled 2020-12-09: qty 8, 2d supply, fill #0

## 2020-12-10 ENCOUNTER — Other Ambulatory Visit (HOSPITAL_COMMUNITY): Payer: Self-pay

## 2020-12-23 ENCOUNTER — Other Ambulatory Visit: Payer: Medicare HMO | Admitting: Internal Medicine

## 2020-12-23 ENCOUNTER — Other Ambulatory Visit (HOSPITAL_COMMUNITY): Payer: Self-pay

## 2020-12-23 ENCOUNTER — Other Ambulatory Visit: Payer: Self-pay | Admitting: Internal Medicine

## 2020-12-23 ENCOUNTER — Other Ambulatory Visit: Payer: Self-pay

## 2020-12-23 DIAGNOSIS — Z6830 Body mass index (BMI) 30.0-30.9, adult: Secondary | ICD-10-CM

## 2020-12-23 DIAGNOSIS — I10 Essential (primary) hypertension: Secondary | ICD-10-CM | POA: Diagnosis not present

## 2020-12-23 DIAGNOSIS — E119 Type 2 diabetes mellitus without complications: Secondary | ICD-10-CM

## 2020-12-23 DIAGNOSIS — Z794 Long term (current) use of insulin: Secondary | ICD-10-CM

## 2020-12-23 MED FILL — Meloxicam Tab 15 MG: ORAL | 90 days supply | Qty: 90 | Fill #0 | Status: AC

## 2020-12-24 ENCOUNTER — Other Ambulatory Visit: Payer: Self-pay | Admitting: Internal Medicine

## 2020-12-24 ENCOUNTER — Ambulatory Visit: Payer: Medicare HMO | Admitting: Internal Medicine

## 2020-12-24 ENCOUNTER — Other Ambulatory Visit (HOSPITAL_COMMUNITY): Payer: Self-pay

## 2020-12-24 LAB — CBC WITH DIFFERENTIAL/PLATELET
Absolute Monocytes: 538 cells/uL (ref 200–950)
Basophils Absolute: 41 cells/uL (ref 0–200)
Basophils Relative: 0.6 %
Eosinophils Absolute: 262 cells/uL (ref 15–500)
Eosinophils Relative: 3.8 %
HCT: 40.7 % (ref 35.0–45.0)
Hemoglobin: 13.3 g/dL (ref 11.7–15.5)
Lymphs Abs: 3188 cells/uL (ref 850–3900)
MCH: 28.4 pg (ref 27.0–33.0)
MCHC: 32.7 g/dL (ref 32.0–36.0)
MCV: 87 fL (ref 80.0–100.0)
MPV: 10.3 fL (ref 7.5–12.5)
Monocytes Relative: 7.8 %
Neutro Abs: 2870 cells/uL (ref 1500–7800)
Neutrophils Relative %: 41.6 %
Platelets: 282 10*3/uL (ref 140–400)
RBC: 4.68 10*6/uL (ref 3.80–5.10)
RDW: 13.3 % (ref 11.0–15.0)
Total Lymphocyte: 46.2 %
WBC: 6.9 10*3/uL (ref 3.8–10.8)

## 2020-12-24 LAB — LIPID PANEL
Cholesterol: 166 mg/dL (ref <200)
HDL: 44 mg/dL — ABNORMAL LOW (ref >=50)
LDL Cholesterol (Calc): 98 mg/dL (calc)
Non-HDL Cholesterol (Calc): 122 mg/dL (calc) (ref <130)
Total CHOL/HDL Ratio: 3.8 (calc) (ref <5.0)
Triglycerides: 144 mg/dL (ref <150)

## 2020-12-24 LAB — COMPLETE METABOLIC PANEL WITH GFR
AG Ratio: 1.3 (calc) (ref 1.0–2.5)
ALT: 29 U/L (ref 6–29)
AST: 30 U/L (ref 10–35)
Albumin: 4.3 g/dL (ref 3.6–5.1)
Alkaline phosphatase (APISO): 53 U/L (ref 37–153)
BUN: 17 mg/dL (ref 7–25)
CO2: 29 mmol/L (ref 20–32)
Calcium: 9.8 mg/dL (ref 8.6–10.4)
Chloride: 102 mmol/L (ref 98–110)
Creat: 0.69 mg/dL (ref 0.50–1.05)
Globulin: 3.3 g/dL (calc) (ref 1.9–3.7)
Glucose, Bld: 130 mg/dL — ABNORMAL HIGH (ref 65–99)
Potassium: 4.3 mmol/L (ref 3.5–5.3)
Sodium: 139 mmol/L (ref 135–146)
Total Bilirubin: 0.4 mg/dL (ref 0.2–1.2)
Total Protein: 7.6 g/dL (ref 6.1–8.1)
eGFR: 96 mL/min/{1.73_m2} (ref >=60)

## 2020-12-24 LAB — MICROALBUMIN / CREATININE URINE RATIO
Creatinine, Urine: 91 mg/dL (ref 20–275)
Microalb Creat Ratio: 30 mcg/mg creat — ABNORMAL HIGH (ref <30)
Microalb, Ur: 2.7 mg/dL

## 2020-12-24 LAB — HEMOGLOBIN A1C
Hgb A1c MFr Bld: 6.9 % of total Hgb — ABNORMAL HIGH (ref <5.7)
Mean Plasma Glucose: 151 mg/dL
eAG (mmol/L): 8.4 mmol/L

## 2020-12-24 MED FILL — Meloxicam Tab 15 MG: ORAL | 90 days supply | Qty: 90 | Fill #0 | Status: CN

## 2020-12-26 ENCOUNTER — Encounter: Payer: Self-pay | Admitting: Internal Medicine

## 2020-12-26 ENCOUNTER — Other Ambulatory Visit: Payer: Self-pay

## 2020-12-26 ENCOUNTER — Ambulatory Visit (INDEPENDENT_AMBULATORY_CARE_PROVIDER_SITE_OTHER): Payer: Medicare HMO | Admitting: Internal Medicine

## 2020-12-26 VITALS — BP 122/82 | HR 75 | Temp 98.3°F | Ht 62.0 in | Wt 168.0 lb

## 2020-12-26 DIAGNOSIS — E119 Type 2 diabetes mellitus without complications: Secondary | ICD-10-CM | POA: Diagnosis not present

## 2020-12-26 DIAGNOSIS — Z23 Encounter for immunization: Secondary | ICD-10-CM | POA: Diagnosis not present

## 2020-12-26 DIAGNOSIS — I10 Essential (primary) hypertension: Secondary | ICD-10-CM | POA: Diagnosis not present

## 2020-12-26 DIAGNOSIS — Z96652 Presence of left artificial knee joint: Secondary | ICD-10-CM | POA: Diagnosis not present

## 2020-12-26 DIAGNOSIS — Z6830 Body mass index (BMI) 30.0-30.9, adult: Secondary | ICD-10-CM

## 2020-12-26 DIAGNOSIS — E8881 Metabolic syndrome: Secondary | ICD-10-CM | POA: Diagnosis not present

## 2020-12-26 DIAGNOSIS — Z794 Long term (current) use of insulin: Secondary | ICD-10-CM

## 2020-12-26 DIAGNOSIS — Z780 Asymptomatic menopausal state: Secondary | ICD-10-CM

## 2020-12-26 DIAGNOSIS — N951 Menopausal and female climacteric states: Secondary | ICD-10-CM

## 2020-12-26 NOTE — Progress Notes (Signed)
Y. Flu vaccine given. RTC in 6 months. No change in medications.  Subjective:    Patient ID: Lisa Robertson, female    DOB: April 13, 1955, 65 y.o.   MRN: [id number]  HPI  65 year old Female for 6 month recheck.  History of essential hypertension, adenomatous colon polyp, insulin-dependent diabetes mellitus, metabolic syndrome, BMI 25.95  Her blood pressure is excellent at 122/82 on losartan, atenolol and HCTZ.  She has history of low HDL cholesterol.  Currently it is 42.  Triglycerides and total cholesterol are normal as is LDL cholesterol.  Hemoglobin A1c is 6.9% in April was 6.6%.  This is excellent control.  CBC is normal.  Fasting glucose is 130.  BUN and creatinine are normal.  Estimated GFR is 96 cc/min which is excellent.  Electrolytes are normal.  Liver functions are normal.  Total protein, albumin, calcium are normal.    Hgb AIC 6.9% and 6.9%.  LDL cholesterol, Total cholesterol, Triglycerides, and LDL normal.  Had Covid vaccine in July. Reminded about Covid boosterbooster.  Review of Systems reminded about diabetic eye exam     Objective:   Physical Exam Blood pressure 122/82 pulse 75 temperature 98.3 degrees by ear thermometer pulse oximetry 97% weight 168 pounds BMI 30.73  Skin: Warm and dry.  Nodes none.  Neck supple without thyromegaly or carotid bruits.  Chest is clear to auscultation.  Cardiac exam: Regular rate and rhythm without ectopy or murmur.  No lower extremity pitting edema.  Diabetic foot exam performed     Assessment & Plan:   Insulin-dependent diabetes mellitus with excellent control  Essential hypertension under excellent control on current regimen.  Has been hypertensive since 1978.  She monitors her blood pressure frequently.  BMI 30.73-continue with diet and exercise efforts  History of plantar fasciitis right foot-no complain about this today  Patient is on statin medication due to diabetes mellitus.  Lipids are normal.  History of left  knee arthroplasty by Dr. Rhona Raider in 2019.  Plan: I am very pleased with her lab values today.  She will continue current medications and follow-up in 6 months.  Given flu vaccine today.  Get COVID booster in the near future.  Patient would like to get bone density study-will likely be normal.

## 2020-12-26 NOTE — Patient Instructions (Addendum)
Have bone density study.  Continue current medications.  Flu vaccine given.  Have COVID-vaccine this Fall.  Return in 6 months.  Labs are stable.  It was a pleasure to see you today.  Keep up the good work.

## 2021-03-18 ENCOUNTER — Other Ambulatory Visit: Payer: Self-pay

## 2021-03-18 ENCOUNTER — Other Ambulatory Visit (HOSPITAL_COMMUNITY): Payer: Self-pay

## 2021-03-18 DIAGNOSIS — E119 Type 2 diabetes mellitus without complications: Secondary | ICD-10-CM

## 2021-03-18 DIAGNOSIS — Z794 Long term (current) use of insulin: Secondary | ICD-10-CM

## 2021-03-18 MED ORDER — ACCU-CHEK GUIDE VI STRP
ORAL_STRIP | 3 refills | Status: DC
  Filled 2021-03-18: qty 400, 90d supply, fill #0
  Filled 2021-03-25: qty 350, 88d supply, fill #0
  Filled 2021-06-18: qty 350, 88d supply, fill #1
  Filled 2021-09-11: qty 350, 87d supply, fill #2
  Filled 2021-11-19 – 2021-11-24 (×2): qty 350, 87d supply, fill #3
  Filled 2022-02-24: qty 200, 50d supply, fill #4

## 2021-03-18 MED ORDER — ACCU-CHEK GUIDE W/DEVICE KIT
PACK | 0 refills | Status: DC
  Filled 2021-03-18: qty 1, 1d supply, fill #0

## 2021-03-25 ENCOUNTER — Other Ambulatory Visit: Payer: Self-pay | Admitting: Internal Medicine

## 2021-03-25 ENCOUNTER — Other Ambulatory Visit (HOSPITAL_COMMUNITY): Payer: Self-pay

## 2021-03-25 MED ORDER — ACCU-CHEK SOFTCLIX LANCETS MISC
3 refills | Status: AC
  Filled 2021-03-25: qty 300, 75d supply, fill #0
  Filled 2021-09-11: qty 300, 75d supply, fill #1
  Filled 2021-11-19: qty 300, 75d supply, fill #2

## 2021-04-21 DIAGNOSIS — H5213 Myopia, bilateral: Secondary | ICD-10-CM | POA: Diagnosis not present

## 2021-04-21 DIAGNOSIS — H524 Presbyopia: Secondary | ICD-10-CM | POA: Diagnosis not present

## 2021-04-21 DIAGNOSIS — H52209 Unspecified astigmatism, unspecified eye: Secondary | ICD-10-CM | POA: Diagnosis not present

## 2021-04-21 LAB — HM DIABETES EYE EXAM

## 2021-05-10 ENCOUNTER — Other Ambulatory Visit (HOSPITAL_COMMUNITY): Payer: Self-pay

## 2021-06-13 ENCOUNTER — Other Ambulatory Visit: Payer: Medicare HMO

## 2021-06-16 ENCOUNTER — Ambulatory Visit
Admission: RE | Admit: 2021-06-16 | Discharge: 2021-06-16 | Disposition: A | Discharge status: Home / Self Care | Payer: Medicare HMO | Source: Ambulatory Visit | Attending: Internal Medicine | Admitting: Internal Medicine

## 2021-06-16 DIAGNOSIS — N951 Menopausal and female climacteric states: Secondary | ICD-10-CM

## 2021-06-16 DIAGNOSIS — Z780 Asymptomatic menopausal state: Secondary | ICD-10-CM | POA: Diagnosis not present

## 2021-06-18 ENCOUNTER — Other Ambulatory Visit: Payer: Self-pay | Admitting: Internal Medicine

## 2021-06-18 ENCOUNTER — Other Ambulatory Visit (HOSPITAL_COMMUNITY): Payer: Self-pay

## 2021-06-18 MED ORDER — METFORMIN HCL 500 MG PO TABS
500.0000 mg | ORAL_TABLET | Freq: Two times a day (BID) | ORAL | 3 refills | Status: DC
  Filled 2021-06-18: qty 180, 90d supply, fill #0
  Filled 2021-09-11: qty 180, 90d supply, fill #1
  Filled 2021-11-19: qty 180, 90d supply, fill #2
  Filled 2022-02-24: qty 180, 90d supply, fill #3

## 2021-06-18 MED ORDER — NOVOLOG FLEXPEN 100 UNIT/ML ~~LOC~~ SOPN
12.0000 [IU] | PEN_INJECTOR | Freq: Three times a day (TID) | SUBCUTANEOUS | 3 refills | Status: DC
  Filled 2021-06-18: qty 30, 84d supply, fill #0
  Filled 2021-09-11: qty 30, 84d supply, fill #1
  Filled 2021-11-19 – 2021-11-24 (×2): qty 30, 84d supply, fill #2
  Filled 2022-02-24: qty 30, 84d supply, fill #3

## 2021-06-18 MED ORDER — LOSARTAN POTASSIUM 100 MG PO TABS
100.0000 mg | ORAL_TABLET | Freq: Every day | ORAL | 3 refills | Status: DC
  Filled 2021-06-18: qty 90, 90d supply, fill #0
  Filled 2021-09-11: qty 90, 90d supply, fill #1
  Filled 2021-11-19: qty 90, 90d supply, fill #2
  Filled 2022-02-24: qty 90, 90d supply, fill #3

## 2021-06-18 MED ORDER — HYDROCHLOROTHIAZIDE 12.5 MG PO CAPS
12.5000 mg | ORAL_CAPSULE | Freq: Two times a day (BID) | ORAL | 3 refills | Status: DC
Start: 2021-06-18 — End: 2022-05-11
  Filled 2021-06-18: qty 180, 90d supply, fill #0
  Filled 2021-09-11: qty 180, 90d supply, fill #1
  Filled 2021-11-19: qty 180, 90d supply, fill #2
  Filled 2022-02-24: qty 180, 90d supply, fill #3

## 2021-06-18 MED ORDER — LEVEMIR FLEXPEN 100 UNIT/ML ~~LOC~~ SOPN
50.0000 [IU] | PEN_INJECTOR | Freq: Every day | SUBCUTANEOUS | 3 refills | Status: DC
  Filled 2021-06-18: qty 45, 90d supply, fill #0
  Filled 2021-09-11: qty 45, 90d supply, fill #1
  Filled 2021-11-19 – 2021-11-24 (×2): qty 45, 90d supply, fill #2
  Filled 2022-02-24: qty 45, 90d supply, fill #3

## 2021-06-18 MED ORDER — DILTIAZEM HCL ER COATED BEADS 300 MG PO CP24
300.0000 mg | ORAL_CAPSULE | Freq: Every day | ORAL | 3 refills | Status: DC
Start: 2021-06-18 — End: 2022-05-11
  Filled 2021-06-18: qty 90, 90d supply, fill #0
  Filled 2021-09-11: qty 90, 90d supply, fill #1
  Filled 2021-11-19: qty 90, 90d supply, fill #2
  Filled 2022-02-24: qty 90, 90d supply, fill #3

## 2021-06-18 MED ORDER — UNIFINE PENTIPS 32G X 4 MM MISC
3 refills | Status: DC
Start: 2021-06-18 — End: 2022-05-11
  Filled 2021-06-18: qty 400, 90d supply, fill #0
  Filled 2021-09-11: qty 400, 90d supply, fill #1
  Filled 2021-11-19: qty 400, 90d supply, fill #2
  Filled 2022-02-24: qty 400, 90d supply, fill #3

## 2021-06-18 MED ORDER — SIMVASTATIN 20 MG PO TABS
20.0000 mg | ORAL_TABLET | Freq: Every evening | ORAL | 3 refills | Status: DC
Start: 2021-06-18 — End: 2022-05-11
  Filled 2021-06-18: qty 90, 90d supply, fill #0
  Filled 2021-09-11: qty 90, 90d supply, fill #1
  Filled 2021-11-19: qty 90, 90d supply, fill #2
  Filled 2022-02-24: qty 90, 90d supply, fill #3

## 2021-06-18 MED ORDER — ATENOLOL 50 MG PO TABS
50.0000 mg | ORAL_TABLET | Freq: Every day | ORAL | 3 refills | Status: DC
Start: 2021-06-18 — End: 2022-05-11
  Filled 2021-06-18: qty 90, 90d supply, fill #0
  Filled 2021-09-11: qty 90, 90d supply, fill #1
  Filled 2021-11-19: qty 90, 90d supply, fill #2
  Filled 2022-02-24: qty 90, 90d supply, fill #3

## 2021-06-18 MED FILL — Meloxicam Tab 15 MG: ORAL | 90 days supply | Qty: 90 | Fill #1 | Status: AC

## 2021-06-19 ENCOUNTER — Other Ambulatory Visit (HOSPITAL_COMMUNITY): Payer: Self-pay

## 2021-06-24 ENCOUNTER — Other Ambulatory Visit: Payer: Medicare HMO

## 2021-06-24 DIAGNOSIS — Z6830 Body mass index (BMI) 30.0-30.9, adult: Secondary | ICD-10-CM

## 2021-06-24 DIAGNOSIS — R5383 Other fatigue: Secondary | ICD-10-CM | POA: Diagnosis not present

## 2021-06-24 DIAGNOSIS — I10 Essential (primary) hypertension: Secondary | ICD-10-CM | POA: Diagnosis not present

## 2021-06-24 DIAGNOSIS — R7401 Elevation of levels of liver transaminase levels: Secondary | ICD-10-CM

## 2021-06-24 DIAGNOSIS — E119 Type 2 diabetes mellitus without complications: Secondary | ICD-10-CM | POA: Diagnosis not present

## 2021-06-24 DIAGNOSIS — Z794 Long term (current) use of insulin: Secondary | ICD-10-CM | POA: Diagnosis not present

## 2021-06-25 NOTE — Addendum Note (Signed)
Addended by: Angus Seller on: 06/25/2021 10:18 AM ? ? Modules accepted: Orders ? ?

## 2021-06-25 NOTE — Addendum Note (Signed)
Addended by: Angus Seller on: 06/25/2021 10:10 AM ? ? Modules accepted: Orders ? ?

## 2021-06-26 LAB — COMPLETE METABOLIC PANEL WITH GFR
AG Ratio: 1.1 (calc) (ref 1.0–2.5)
ALT: 33 U/L — ABNORMAL HIGH (ref 6–29)
AST: 36 U/L — ABNORMAL HIGH (ref 10–35)
Albumin: 3.9 g/dL (ref 3.6–5.1)
Alkaline phosphatase (APISO): 57 U/L (ref 37–153)
BUN: 16 mg/dL (ref 7–25)
CO2: 26 mmol/L (ref 20–32)
Calcium: 9.4 mg/dL (ref 8.6–10.4)
Chloride: 101 mmol/L (ref 98–110)
Creat: 0.65 mg/dL (ref 0.50–1.05)
Globulin: 3.5 g/dL (calc) (ref 1.9–3.7)
Glucose, Bld: 156 mg/dL — ABNORMAL HIGH (ref 65–99)
Potassium: 4.1 mmol/L (ref 3.5–5.3)
Sodium: 139 mmol/L (ref 135–146)
Total Bilirubin: 0.4 mg/dL (ref 0.2–1.2)
Total Protein: 7.4 g/dL (ref 6.1–8.1)
eGFR: 96 mL/min/{1.73_m2} (ref >=60)

## 2021-06-26 LAB — HEMOGLOBIN A1C
Hgb A1c MFr Bld: 7.7 % of total Hgb — ABNORMAL HIGH (ref <5.7)
Mean Plasma Glucose: 174 mg/dL
eAG (mmol/L): 9.7 mmol/L

## 2021-06-26 LAB — CBC WITH DIFFERENTIAL/PLATELET
Absolute Monocytes: 461 cells/uL (ref 200–950)
Basophils Absolute: 29 cells/uL (ref 0–200)
Basophils Relative: 0.4 %
Eosinophils Absolute: 252 cells/uL (ref 15–500)
Eosinophils Relative: 3.5 %
HCT: 41.1 % (ref 35.0–45.0)
Hemoglobin: 13.4 g/dL (ref 11.7–15.5)
Lymphs Abs: 3204 cells/uL (ref 850–3900)
MCH: 28.3 pg (ref 27.0–33.0)
MCHC: 32.6 g/dL (ref 32.0–36.0)
MCV: 86.9 fL (ref 80.0–100.0)
MPV: 10.5 fL (ref 7.5–12.5)
Monocytes Relative: 6.4 %
Neutro Abs: 3254 cells/uL (ref 1500–7800)
Neutrophils Relative %: 45.2 %
Platelets: 310 10*3/uL (ref 140–400)
RBC: 4.73 10*6/uL (ref 3.80–5.10)
RDW: 13.4 % (ref 11.0–15.0)
Total Lymphocyte: 44.5 %
WBC: 7.2 10*3/uL (ref 3.8–10.8)

## 2021-06-26 LAB — LIPID PANEL
Cholesterol: 165 mg/dL (ref <200)
HDL: 45 mg/dL — ABNORMAL LOW (ref >=50)
LDL Cholesterol (Calc): 97 mg/dL (calc)
Non-HDL Cholesterol (Calc): 120 mg/dL (calc) (ref <130)
Total CHOL/HDL Ratio: 3.7 (calc) (ref <5.0)
Triglycerides: 131 mg/dL (ref <150)

## 2021-06-26 LAB — MICROALBUMIN, URINE: Microalb, Ur: 3.1 mg/dL

## 2021-06-26 LAB — GAMMA GT: GGT: 19 U/L (ref 3–65)

## 2021-06-26 LAB — TSH: TSH: 1.38 mIU/L (ref 0.40–4.50)

## 2021-06-27 ENCOUNTER — Ambulatory Visit (INDEPENDENT_AMBULATORY_CARE_PROVIDER_SITE_OTHER): Payer: Medicare HMO | Admitting: Internal Medicine

## 2021-06-27 ENCOUNTER — Encounter: Payer: Self-pay | Admitting: Internal Medicine

## 2021-06-27 VITALS — BP 112/72 | HR 72 | Temp 98.0°F | Ht 63.25 in | Wt 167.5 lb

## 2021-06-27 DIAGNOSIS — E8881 Metabolic syndrome: Secondary | ICD-10-CM | POA: Diagnosis not present

## 2021-06-27 DIAGNOSIS — Z0000 Encounter for general adult medical examination without abnormal findings: Secondary | ICD-10-CM | POA: Diagnosis not present

## 2021-06-27 DIAGNOSIS — Z780 Asymptomatic menopausal state: Secondary | ICD-10-CM

## 2021-06-27 DIAGNOSIS — Z96652 Presence of left artificial knee joint: Secondary | ICD-10-CM

## 2021-06-27 DIAGNOSIS — E119 Type 2 diabetes mellitus without complications: Secondary | ICD-10-CM | POA: Diagnosis not present

## 2021-06-27 DIAGNOSIS — Z794 Long term (current) use of insulin: Secondary | ICD-10-CM | POA: Diagnosis not present

## 2021-06-27 DIAGNOSIS — Z6829 Body mass index (BMI) 29.0-29.9, adult: Secondary | ICD-10-CM

## 2021-06-27 DIAGNOSIS — I10 Essential (primary) hypertension: Secondary | ICD-10-CM | POA: Diagnosis not present

## 2021-06-27 LAB — POCT URINALYSIS DIPSTICK
Bilirubin, UA: NEGATIVE
Blood, UA: NEGATIVE
Glucose, UA: NEGATIVE
Ketones, UA: NEGATIVE
Leukocytes, UA: NEGATIVE
Nitrite, UA: NEGATIVE
Protein, UA: NEGATIVE
Spec Grav, UA: 1.015 (ref 1.010–1.025)
Urobilinogen, UA: 0.2 E.U./dL
pH, UA: 6 (ref 5.0–8.0)

## 2021-06-27 NOTE — Progress Notes (Signed)
? ? ? ?Annual Wellness Visit ? ?  ? ?Patient: Lisa Robertson, Female    DOB: 1955-08-29, 66 y.o.   MRN: [id number] ?Visit Date: 06/27/2021 ? ?Chief Complaint  ?Patient presents with  ? Medicare Wellness  ? ?Subjective  ?  ?Dalores Weger is a 66 y.o. female who presents today for her Annual Wellness Visit. ? ?HPI She is also presents for health maintenance exam and evaluation of medical issues. ? ?She has a history of essential hypertension, insulin-dependent diabetes, history of plantar fasciitis right foot. ? ?Wears glasses and has annual diabetic eye exam. ? ?Past medical history: History of tubal ligation.  History of total abdominal hysterectomy without oophorectomy for fibroids by Dr. Nori Riis in 06/03/1996. ? ?Has been hypertensive since 1976-06-03.  Has been to diabetic educator at diabetes treatment center.  She is on statin therapy.  She monitors her blood pressure at home frequently. ? ?History of left knee arthroplasty by Dr. Nolon Bussing Dorp in 06-03-17.  History of right fourth trigger finger seen by Dr. Demetrius Revel. ? ?Social history: She is married.  2 sons and 1 daughter.  Does not smoke or consume alcohol.  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 ? ?Family history: Son with history of kidney transplant.  Father with history of hypertension, prostate cancer, diabetes and heart disease.  Mother died of a stroke in 03-Jun-2016 with history of diabetes and hypertension.  3 sisters and no brothers. ? ? ? ? ? ?Patient Care Team: ?Elby Showers, MD as PCP - General (Internal Medicine) ? ?Review of Systems ? ? Objective  ?  ?Vitals:  ? ?Physical Exam blood pressure 112/72 pulse 72 temperature 98 degrees pulse oximetry 98% weight 167 pounds 8 ounces BMI 29.44 ? ?Skin warm and dry.  No cervical adenopathy.  No thyromegaly.  No carotid bruits.  TMs clear.  Neck is supple.  Chest is clear to auscultation.  Cardiac exam: Regular rate and  rhythm without ectopy or murmurs.  Breasts are without masses.  Abdomen is soft nondistended without hepatosplenomegaly masses or tenderness.  Bimanual is normal.  Pap smear not done due to age.  No lower extremity pitting edema.  Her affect, thought, and judgment are normal.  No focal deficits on brief neurological exam. ? ? ?Most recent functional status assessment: ? ?  06/27/2021  ? 11:06 AM  ?In your present state of health, do you have any difficulty performing the following activities:  ?Hearing? 0  ?Vision? 0  ?Difficulty concentrating or making decisions? 0  ?Walking or climbing stairs? 0  ?Dressing or bathing? 0  ?Doing errands, shopping? 0  ?Preparing Food and eating ? N  ?Using the Toilet? N  ?In the past six months, have you accidently leaked urine? N  ?Do you have problems with loss of bowel control? N  ?Managing your Medications? N  ?Managing your Finances? N  ?Housekeeping or managing your Housekeeping? N  ? ?Most recent fall risk assessment: ? ?  06/27/2021  ? 11:05 AM  ?Fall Risk   ?Falls in the past year? 0  ?Number falls in past yr: 0  ?Injury with Fall? 0  ?Risk for fall due to : No Fall Risks  ? ? Most recent depression screenings: ? ?  06/27/2021  ? 11:05 AM 12/26/2020  ? 10:55 AM  ?PHQ 2/9 Scores  ?PHQ - 2 Score 0 0  ? ?Most recent cognitive  screening: ? ?  06/27/2021  ? 11:06 AM  ?6CIT Screen  ?What Year? 0 points  ?What month? 0 points  ?What time? 0 points  ?Count back from 20 0 points  ?Months in reverse 0 points  ?Repeat phrase 0 points  ?Total Score 0 points  ? ? ? ? ? Assessment & Plan  ? ?Type 2 diabetes mellitus-hemoglobin stable at 7.7% but was better in October 2022 at 6.9%.  She is on NovoLog FlexPen 12 units 3 times daily with meals and Levemir FlexPen 50 units in scan daily at 10 PM.  She also takes metformin 500 mg twice daily with a meal. ? ?Regarding hypertension she is on HCTZ 25 mg daily, losartan 100 mg daily and diltiazem 300 mg daily as well as Tenormin 50 mg daily. ? ?For  hyperlipidemia she is on Zocor 20 mg daily ? ?For musculoskeletal pain she takes meloxicam 15 mg daily. ? ?Her best hemoglobin A1c in the last several years was 6.2% in March 2020.  She is excellent about taking her medications and trying to walk daily. ? ?Left knee arthroplasty does not bother her. ? ?History of right fourth trigger finger ? ?Had colonoscopy in 2019 ? ?Has had Shingrix vaccines, annual flu vaccine, Prevnar 13 and pneumococcal 23 vaccine.  Has had several COVID vaccinations the last 1 in November 2022.  Mammogram was done in September 2022. ? ?Plan: Overall I think she is doing well and can return in 3 months for follow-up.  She is going to increase her insulin dose from 10 units 3 times a day to 12 units 3 times a day and follow-up in 3 months. ? ? ?  ? ?Annual wellness visit done today including the all of the following: ?Reviewed patient's Family Medical History ?Reviewed and updated list of patient's medical providers ?Assessment of cognitive impairment was done ?Assessed patient's functional ability ?Established a written schedule for health screening services ?Health Risk Assessent Completed and Reviewed ? ?Discussed health benefits of physical activity, and encouraged her to engage in regular exercise appropriate for her age and condition.  ?  ? ? ?  ?I, Elby Showers, MD, have reviewed all documentation for this visit. The documentation on 07/12/21 for the exam, diagnosis, procedures, and orders are all accurate and complete.provider I, Elby Showers, MD, have reviewed all documentation for this visit. The documentation on 07/12/21 for the exam, diagnosis, procedures, and orders are all accurate and complete. ? ? ?Angus Seller, CMA  ?

## 2021-06-27 NOTE — Patient Instructions (Addendum)
Increase Insulin to 12 units 3 times a day from 10. RTC in 3 months.  It was a pleasure to see you today.  Continue diet and exercise regimen. ?

## 2021-07-03 ENCOUNTER — Other Ambulatory Visit (HOSPITAL_COMMUNITY): Payer: Self-pay

## 2021-09-11 ENCOUNTER — Other Ambulatory Visit (HOSPITAL_COMMUNITY): Payer: Self-pay

## 2021-09-12 ENCOUNTER — Other Ambulatory Visit (HOSPITAL_COMMUNITY): Payer: Self-pay

## 2021-09-25 ENCOUNTER — Other Ambulatory Visit: Payer: Medicare HMO

## 2021-09-26 ENCOUNTER — Other Ambulatory Visit: Payer: Medicare HMO

## 2021-09-26 ENCOUNTER — Ambulatory Visit: Payer: Medicare HMO | Admitting: Internal Medicine

## 2021-09-26 DIAGNOSIS — Z794 Long term (current) use of insulin: Secondary | ICD-10-CM | POA: Diagnosis not present

## 2021-09-26 DIAGNOSIS — E119 Type 2 diabetes mellitus without complications: Secondary | ICD-10-CM | POA: Diagnosis not present

## 2021-09-27 LAB — HEMOGLOBIN A1C
Hgb A1c MFr Bld: 7.1 % of total Hgb — ABNORMAL HIGH (ref <5.7)
Mean Plasma Glucose: 157 mg/dL
eAG (mmol/L): 8.7 mmol/L

## 2021-09-30 ENCOUNTER — Other Ambulatory Visit: Payer: Medicare HMO

## 2021-10-02 ENCOUNTER — Other Ambulatory Visit (HOSPITAL_COMMUNITY): Payer: Self-pay

## 2021-10-02 ENCOUNTER — Encounter: Payer: Self-pay | Admitting: Internal Medicine

## 2021-10-02 ENCOUNTER — Ambulatory Visit (INDEPENDENT_AMBULATORY_CARE_PROVIDER_SITE_OTHER): Payer: Medicare HMO | Admitting: Internal Medicine

## 2021-10-02 VITALS — BP 118/72 | HR 71 | Temp 97.7°F | Ht 63.25 in | Wt 167.5 lb

## 2021-10-02 DIAGNOSIS — E119 Type 2 diabetes mellitus without complications: Secondary | ICD-10-CM

## 2021-10-02 DIAGNOSIS — Z6829 Body mass index (BMI) 29.0-29.9, adult: Secondary | ICD-10-CM | POA: Diagnosis not present

## 2021-10-02 DIAGNOSIS — I10 Essential (primary) hypertension: Secondary | ICD-10-CM | POA: Diagnosis not present

## 2021-10-02 DIAGNOSIS — L304 Erythema intertrigo: Secondary | ICD-10-CM | POA: Diagnosis not present

## 2021-10-02 DIAGNOSIS — Z794 Long term (current) use of insulin: Secondary | ICD-10-CM | POA: Diagnosis not present

## 2021-10-02 MED ORDER — DEXCOM G6 RECEIVER DEVI
1.0000 | 3 refills | Status: DC
  Filled 2021-10-02 – 2021-10-03 (×2): qty 1, 90d supply, fill #0

## 2021-10-02 MED ORDER — DEXCOM G7 SENSOR MISC
3 refills | Status: DC
  Filled 2021-10-02: qty 3, 90d supply, fill #0
  Filled 2021-10-06: qty 1, 90d supply, fill #0
  Filled 2021-10-07 – 2021-10-14 (×2): qty 9, 90d supply, fill #0

## 2021-10-02 MED ORDER — KETOCONAZOLE 2 % EX CREA
1.0000 | TOPICAL_CREAM | Freq: Every day | CUTANEOUS | 99 refills | Status: DC
  Filled 2021-10-02: qty 15, 7d supply, fill #0
  Filled 2021-11-11: qty 15, 7d supply, fill #1
  Filled 2022-07-06: qty 15, 7d supply, fill #2
  Filled 2022-08-27: qty 15, 7d supply, fill #3

## 2021-10-02 NOTE — Progress Notes (Signed)
   Subjective:    Patient ID: Lisa Robertson, female    DOB: 28-Apr-1955, 66 y.o.   MRN: [id number]  HPI Here for follow up on diabetes. Has been watching diet.Does get  some light exercise.Asking about Dexcom meter. Order sent to [facility] for this device.  Hgb AIC is 7.1 % and was 7.7% in April.Have asked her to increase Novolog to 15 Units at dinner.  Trying to exercise some but weather has been hot.  Needs to consider Pneumococcal 20 vaccine.  Needs to consider COVID-vaccine in the fall.  Has had Shingrix series.  Will get flu vaccine in the fall as well.  Blood pressure is excellent at 118/72 on Tenormin 50 mg daily, Cartia XT 300 mg daily and HCTZ 12.5 mg twice daily.  She also takes losartan 100 mg daily.  Review of Systems rash under both breasts that is itchy and irritated.     Objective:   Physical Exam Blood pressure 118/72, pulse 71, temperature 97.7 degrees, pulse oximetry 97%, weight 167 pounds 8 ounces, BMI 29.44  Skin: Warm and dry.  Has intertrigo under both breasts.  Neck supple without thyromegaly JVD or carotid bruits.  Chest clear to auscultation.  Cardiac exam: Regular rate and rhythm without ectopy.  No lower extremity pitting edema.  Hemoglobin A1c improved from 7.7 in April to 7.1%.     Assessment & Plan:  Insulin-dependent diabetes mellitus-increase NovoLog to 15 units at dinner only.  Keep other insulin doses the same and return in 6 weeks for hemoglobin A1c and office visit.  Dexcom order sent to [facility] pharmacy.  Essential hypertension-stable on current regimen  Intertrigo under both breasts-treated with Nizoral cream daily until resolved

## 2021-10-02 NOTE — Patient Instructions (Addendum)
Increase Novolog to 15 units at dinner only. Keep the other insulin doses the same. RTC in 6 weeks for Hgb AIC and OV. Use ketoconazole cream under breasts daily. Order faxed to [place] for Dexcom meter.

## 2021-10-03 ENCOUNTER — Other Ambulatory Visit (HOSPITAL_COMMUNITY): Payer: Self-pay

## 2021-10-06 ENCOUNTER — Other Ambulatory Visit (HOSPITAL_COMMUNITY): Payer: Self-pay

## 2021-10-07 ENCOUNTER — Other Ambulatory Visit (HOSPITAL_COMMUNITY): Payer: Self-pay

## 2021-10-14 ENCOUNTER — Other Ambulatory Visit (HOSPITAL_COMMUNITY): Payer: Self-pay

## 2021-11-12 ENCOUNTER — Other Ambulatory Visit (HOSPITAL_COMMUNITY): Payer: Self-pay

## 2021-11-18 ENCOUNTER — Other Ambulatory Visit: Payer: Medicare HMO

## 2021-11-18 DIAGNOSIS — E119 Type 2 diabetes mellitus without complications: Secondary | ICD-10-CM

## 2021-11-18 DIAGNOSIS — Z794 Long term (current) use of insulin: Secondary | ICD-10-CM | POA: Diagnosis not present

## 2021-11-19 ENCOUNTER — Other Ambulatory Visit (HOSPITAL_COMMUNITY): Payer: Self-pay

## 2021-11-19 LAB — HEMOGLOBIN A1C
Hgb A1c MFr Bld: 7.3 % of total Hgb — ABNORMAL HIGH (ref <5.7)
Mean Plasma Glucose: 163 mg/dL
eAG (mmol/L): 9 mmol/L

## 2021-11-20 ENCOUNTER — Ambulatory Visit (INDEPENDENT_AMBULATORY_CARE_PROVIDER_SITE_OTHER): Payer: Medicare HMO | Admitting: Internal Medicine

## 2021-11-20 ENCOUNTER — Encounter: Payer: Self-pay | Admitting: Internal Medicine

## 2021-11-20 VITALS — BP 134/76 | HR 69 | Temp 97.8°F | Ht 63.25 in | Wt 170.0 lb

## 2021-11-20 DIAGNOSIS — Z794 Long term (current) use of insulin: Secondary | ICD-10-CM | POA: Diagnosis not present

## 2021-11-20 DIAGNOSIS — Z6829 Body mass index (BMI) 29.0-29.9, adult: Secondary | ICD-10-CM

## 2021-11-20 DIAGNOSIS — I10 Essential (primary) hypertension: Secondary | ICD-10-CM | POA: Diagnosis not present

## 2021-11-20 DIAGNOSIS — E119 Type 2 diabetes mellitus without complications: Secondary | ICD-10-CM | POA: Diagnosis not present

## 2021-11-20 NOTE — Progress Notes (Signed)
   Subjective:    Patient ID: Lisa Robertson, female    DOB: [birth year], 66 y.o.   MRN: [id number]  HPI 66 year old Female seen for follow up on diabetes. Was not able to get continuous glucose monitor due to expense.  However, Hgb AIC has improved from 7.7% to 7.1%. Best Hgb AIC was 6.6% in April 2022 and 6.2 % in March 2020.  At last visit, it was noted she was not able to exercise as much due to hot weather.  At that time we increase NovoLog to 15 units at dinner only and kept  other insulin doses the same.  Blood pressure is stable on current regimen at 134/76  Weight is 170 pounds and was 167 pounds in July.   She watches her Accu-Cheks regularly.  Has regular diabetic eye exam annually.  Vaccines discussed.   Review of Systems feels well with no new complaints     Objective:   Physical Exam  Blood pressure 134/76 pulse 69 regular temperature 97.8 degrees pulse oximetry 98% weight 170 pounds BMI 29.88  Skin: Warm and dry.  No cervical adenopathy or carotid bruits.  No thyromegaly.  Chest clear.  Cardiac exam: Regular rate and rhythm without ectopy.  No lower extremity pitting edema.      Assessment & Plan:   Insulin-dependent diabetes mellitus-improved hemoglobin A1c.  Referral to Endocrinology for consultation regarding management.   Lipids checked April 2023 and were normal with the exception of the low HDL on simvastatin 20 mg daily  Essential hypertension-stable on current regimen treated with Tenormin, Cartia XT, HCTZ, losartan  BMI 29.88 with weight 170 pounds-weight has increased slightly from July when it was 167 pounds 8 ounces.  Plan: Hemoglobin A1c is stable at 7.1% and previously was 7.7% in April.  I am pleased with his progress.  However, I think she is best served with Endocrinology referral for evaluation.  She is agreeable to this.  We will make referral.  Vaccines that are needed in the upcoming future are discussed with her today and she will  obtain these through Pharmacy.  Health maintenance exam is due April 2023.

## 2021-11-20 NOTE — Patient Instructions (Signed)
Referral to Endocrinology for consult on diabetic management. RTC April For CPE and medicare wellness visit. Vaccines discussed.

## 2021-11-24 ENCOUNTER — Other Ambulatory Visit (HOSPITAL_COMMUNITY): Payer: Self-pay

## 2021-11-24 MED ORDER — TETANUS-DIPHTH-ACELL PERTUSSIS 5-2.5-18.5 LF-MCG/0.5 IM SUSY
PREFILLED_SYRINGE | INTRAMUSCULAR | 0 refills | Status: DC
  Filled 2021-11-24: qty 0.5, 1d supply, fill #0

## 2021-11-25 ENCOUNTER — Other Ambulatory Visit (HOSPITAL_COMMUNITY): Payer: Self-pay

## 2021-12-11 ENCOUNTER — Other Ambulatory Visit (HOSPITAL_COMMUNITY): Payer: Self-pay

## 2021-12-11 ENCOUNTER — Other Ambulatory Visit: Payer: Self-pay | Admitting: Internal Medicine

## 2021-12-11 MED ORDER — MELOXICAM 15 MG PO TABS
15.0000 mg | ORAL_TABLET | Freq: Every day | ORAL | 1 refills | Status: DC
Start: 2021-12-11 — End: 2022-08-27
  Filled 2021-12-11: qty 90, 90d supply, fill #0
  Filled 2022-05-26: qty 90, 90d supply, fill #1

## 2021-12-19 ENCOUNTER — Other Ambulatory Visit: Payer: Self-pay | Admitting: Internal Medicine

## 2021-12-19 DIAGNOSIS — Z1231 Encounter for screening mammogram for malignant neoplasm of breast: Secondary | ICD-10-CM

## 2021-12-23 ENCOUNTER — Other Ambulatory Visit (HOSPITAL_COMMUNITY): Payer: Self-pay

## 2021-12-24 ENCOUNTER — Other Ambulatory Visit (HOSPITAL_COMMUNITY): Payer: Self-pay

## 2021-12-24 MED ORDER — AMOXICILLIN 500 MG PO CAPS
2000.0000 mg | ORAL_CAPSULE | ORAL | 0 refills | Status: DC
  Filled 2021-12-24: qty 8, 2d supply, fill #0

## 2021-12-25 ENCOUNTER — Other Ambulatory Visit (HOSPITAL_COMMUNITY): Payer: Self-pay

## 2021-12-26 ENCOUNTER — Ambulatory Visit: Payer: Medicare HMO

## 2022-01-23 ENCOUNTER — Ambulatory Visit: Payer: Medicare HMO

## 2022-01-23 ENCOUNTER — Other Ambulatory Visit (HOSPITAL_COMMUNITY): Payer: Self-pay

## 2022-02-09 ENCOUNTER — Other Ambulatory Visit (HOSPITAL_COMMUNITY): Payer: Self-pay

## 2022-02-24 ENCOUNTER — Other Ambulatory Visit (HOSPITAL_COMMUNITY): Payer: Self-pay

## 2022-02-25 ENCOUNTER — Other Ambulatory Visit (HOSPITAL_COMMUNITY): Payer: Self-pay

## 2022-02-25 ENCOUNTER — Other Ambulatory Visit: Payer: Self-pay | Admitting: Internal Medicine

## 2022-02-25 ENCOUNTER — Other Ambulatory Visit: Payer: Self-pay

## 2022-02-25 DIAGNOSIS — E119 Type 2 diabetes mellitus without complications: Secondary | ICD-10-CM

## 2022-02-25 MED ORDER — ACCU-CHEK GUIDE VI STRP
ORAL_STRIP | 3 refills | Status: DC
  Filled 2022-02-25: qty 350, 88d supply, fill #0
  Filled 2022-05-11: qty 350, 88d supply, fill #1

## 2022-02-26 ENCOUNTER — Other Ambulatory Visit (HOSPITAL_COMMUNITY): Payer: Self-pay

## 2022-03-17 ENCOUNTER — Other Ambulatory Visit (HOSPITAL_COMMUNITY): Payer: Self-pay

## 2022-04-30 ENCOUNTER — Ambulatory Visit
Admission: RE | Admit: 2022-04-30 | Discharge: 2022-04-30 | Disposition: A | Discharge status: Home / Self Care | Payer: Medicare HMO | Source: Ambulatory Visit | Attending: Internal Medicine | Admitting: Internal Medicine

## 2022-04-30 DIAGNOSIS — Z1231 Encounter for screening mammogram for malignant neoplasm of breast: Secondary | ICD-10-CM | POA: Diagnosis not present

## 2022-05-04 DIAGNOSIS — Z0289 Encounter for other administrative examinations: Secondary | ICD-10-CM

## 2022-05-11 ENCOUNTER — Other Ambulatory Visit (HOSPITAL_COMMUNITY): Payer: Self-pay

## 2022-05-11 ENCOUNTER — Other Ambulatory Visit: Payer: Self-pay | Admitting: Internal Medicine

## 2022-05-11 ENCOUNTER — Other Ambulatory Visit: Payer: Self-pay

## 2022-05-11 MED ORDER — METFORMIN HCL 500 MG PO TABS
500.0000 mg | ORAL_TABLET | Freq: Two times a day (BID) | ORAL | 3 refills | Status: DC
  Filled 2022-05-11: qty 180, 90d supply, fill #0
  Filled 2022-08-05: qty 180, 90d supply, fill #1
  Filled 2022-11-03: qty 180, 90d supply, fill #2
  Filled 2023-02-01: qty 180, 90d supply, fill #3

## 2022-05-11 MED ORDER — TECHLITE PEN NEEDLES 32G X 4 MM MISC
3 refills | Status: DC
  Filled 2022-05-11 (×2): qty 400, 90d supply, fill #0
  Filled 2022-08-05: qty 400, 90d supply, fill #1
  Filled 2023-02-01: qty 400, 90d supply, fill #2
  Filled 2023-04-28: qty 400, 90d supply, fill #3

## 2022-05-11 MED ORDER — LEVEMIR FLEXPEN 100 UNIT/ML ~~LOC~~ SOPN
50.0000 [IU] | PEN_INJECTOR | Freq: Every day | SUBCUTANEOUS | 3 refills | Status: DC
  Filled 2022-05-11 – 2022-05-26 (×2): qty 45, 90d supply, fill #0

## 2022-05-11 MED ORDER — ATENOLOL 50 MG PO TABS
50.0000 mg | ORAL_TABLET | Freq: Every day | ORAL | 3 refills | Status: AC
  Filled 2022-05-11: qty 90, 90d supply, fill #0
  Filled 2022-08-05: qty 90, 90d supply, fill #1
  Filled 2022-11-03: qty 90, 90d supply, fill #2
  Filled 2023-02-01: qty 90, 90d supply, fill #3

## 2022-05-11 MED ORDER — DILTIAZEM HCL ER COATED BEADS 300 MG PO CP24
300.0000 mg | ORAL_CAPSULE | Freq: Every day | ORAL | 3 refills | Status: DC
  Filled 2022-05-11: qty 90, 90d supply, fill #0
  Filled 2022-08-05: qty 90, 90d supply, fill #1
  Filled 2022-11-03: qty 90, 90d supply, fill #2
  Filled 2023-02-01: qty 90, 90d supply, fill #3

## 2022-05-11 MED ORDER — SIMVASTATIN 20 MG PO TABS
20.0000 mg | ORAL_TABLET | Freq: Every evening | ORAL | 3 refills | Status: DC
  Filled 2022-05-11: qty 90, 90d supply, fill #0
  Filled 2022-08-05: qty 90, 90d supply, fill #1
  Filled 2022-11-03: qty 90, 90d supply, fill #2
  Filled 2023-02-01: qty 90, 90d supply, fill #3

## 2022-05-11 MED ORDER — LOSARTAN POTASSIUM 100 MG PO TABS
100.0000 mg | ORAL_TABLET | Freq: Every day | ORAL | 3 refills | Status: DC
  Filled 2022-05-11: qty 90, 90d supply, fill #0
  Filled 2022-08-05: qty 90, 90d supply, fill #1
  Filled 2022-11-03: qty 90, 90d supply, fill #2

## 2022-05-11 MED ORDER — HYDROCHLOROTHIAZIDE 12.5 MG PO CAPS
12.5000 mg | ORAL_CAPSULE | Freq: Two times a day (BID) | ORAL | 3 refills | Status: DC
  Filled 2022-05-11: qty 180, 90d supply, fill #0
  Filled 2022-08-05: qty 180, 90d supply, fill #1
  Filled 2022-11-03: qty 180, 90d supply, fill #2
  Filled 2023-02-01: qty 180, 90d supply, fill #3

## 2022-05-20 ENCOUNTER — Other Ambulatory Visit (HOSPITAL_COMMUNITY): Payer: Self-pay

## 2022-05-26 ENCOUNTER — Other Ambulatory Visit: Payer: Self-pay

## 2022-05-26 ENCOUNTER — Other Ambulatory Visit (HOSPITAL_COMMUNITY): Payer: Self-pay

## 2022-05-26 ENCOUNTER — Other Ambulatory Visit: Payer: Self-pay | Admitting: Internal Medicine

## 2022-05-26 DIAGNOSIS — E119 Type 2 diabetes mellitus without complications: Secondary | ICD-10-CM

## 2022-05-26 MED ORDER — LEVEMIR FLEXPEN 100 UNIT/ML ~~LOC~~ SOPN
50.0000 [IU] | PEN_INJECTOR | Freq: Every day | SUBCUTANEOUS | 3 refills | Status: DC
  Filled 2022-05-26: qty 45, 90d supply, fill #0

## 2022-05-26 MED ORDER — NOVOLOG FLEXPEN 100 UNIT/ML ~~LOC~~ SOPN
12.0000 [IU] | PEN_INJECTOR | Freq: Three times a day (TID) | SUBCUTANEOUS | 3 refills | Status: DC
  Filled 2022-05-26: qty 30, 84d supply, fill #0
  Filled 2022-08-27: qty 30, 84d supply, fill #1
  Filled 2022-11-23: qty 30, 84d supply, fill #2
  Filled 2023-02-24: qty 30, 84d supply, fill #3

## 2022-05-26 MED ORDER — ACCU-CHEK GUIDE VI STRP
ORAL_STRIP | 3 refills | Status: DC
  Filled 2022-05-26: qty 400, fill #0
  Filled 2022-10-05: qty 300, 88d supply, fill #0

## 2022-05-27 ENCOUNTER — Other Ambulatory Visit (HOSPITAL_COMMUNITY): Payer: Self-pay

## 2022-05-28 ENCOUNTER — Other Ambulatory Visit: Payer: Self-pay

## 2022-05-28 ENCOUNTER — Other Ambulatory Visit (HOSPITAL_COMMUNITY): Payer: Self-pay

## 2022-05-28 DIAGNOSIS — Z794 Long term (current) use of insulin: Secondary | ICD-10-CM

## 2022-05-28 MED ORDER — INSULIN GLARGINE 100 UNIT/ML SOLOSTAR PEN
12.0000 [IU] | PEN_INJECTOR | Freq: Every day | SUBCUTANEOUS | 11 refills | Status: DC
  Filled 2022-05-28: qty 15, 41d supply, fill #0
  Filled 2022-07-06: qty 15, 41d supply, fill #1

## 2022-05-28 MED ORDER — ACCU-CHEK SOFTCLIX LANCETS MISC
12 refills | Status: DC
  Filled 2022-05-28: qty 100, 90d supply, fill #0

## 2022-05-29 ENCOUNTER — Other Ambulatory Visit (HOSPITAL_COMMUNITY): Payer: Self-pay

## 2022-05-29 MED ORDER — DEXCOM G7 SENSOR MISC
3 refills | Status: DC
  Filled 2022-05-29 – 2022-07-10 (×3): qty 9, 90d supply, fill #0
  Filled 2022-10-05 – 2022-10-06 (×2): qty 9, 90d supply, fill #1
  Filled 2022-12-23 – 2022-12-25 (×3): qty 9, 90d supply, fill #2
  Filled 2023-04-12: qty 9, 90d supply, fill #3

## 2022-05-29 MED ORDER — ACCU-CHEK SOFTCLIX LANCETS MISC
1 refills | Status: AC
  Filled 2022-05-29 – 2022-07-08 (×3): qty 200, 90d supply, fill #0

## 2022-05-29 MED ORDER — INSULIN GLARGINE 100 UNIT/ML SOLOSTAR PEN
50.0000 [IU] | PEN_INJECTOR | Freq: Every day | SUBCUTANEOUS | 2 refills | Status: DC
  Filled 2022-05-29 – 2022-07-09 (×2): qty 45, 90d supply, fill #0
  Filled 2022-10-05: qty 45, 90d supply, fill #1
  Filled 2022-12-23: qty 45, 90d supply, fill #2

## 2022-05-29 MED ORDER — INSULIN GLARGINE 100 UNIT/ML SOLOSTAR PEN
50.0000 [IU] | PEN_INJECTOR | Freq: Every day | SUBCUTANEOUS | 11 refills | Status: DC
  Filled 2022-05-29: qty 15, 30d supply, fill #0

## 2022-05-29 MED ORDER — INSULIN GLARGINE 100 UNIT/ML SOLOSTAR PEN
50.0000 [IU] | PEN_INJECTOR | Freq: Every day | SUBCUTANEOUS | 2 refills | Status: DC
  Filled 2022-05-29: qty 15, 30d supply, fill #0

## 2022-05-29 NOTE — Addendum Note (Signed)
Addended by: Geradine Girt D on: 05/29/2022 11:39 AM   Modules accepted: Orders

## 2022-05-29 NOTE — Addendum Note (Signed)
Addended by: Geradine Girt D on: 05/29/2022 09:58 AM   Modules accepted: Orders

## 2022-05-29 NOTE — Addendum Note (Signed)
Addended by: Geradine Girt D on: 05/29/2022 09:48 AM   Modules accepted: Orders

## 2022-06-09 ENCOUNTER — Other Ambulatory Visit (HOSPITAL_COMMUNITY): Payer: Self-pay

## 2022-06-12 ENCOUNTER — Telehealth: Payer: Self-pay | Admitting: Pharmacy Technician

## 2022-06-12 NOTE — Telephone Encounter (Signed)
Patient Advocate Encounter  Prior Authorization for [employer]  has been approved.    PA# [id number] Effective dates: 03/16/22 through 03/16/23

## 2022-06-12 NOTE — Telephone Encounter (Signed)
Patient Advocate Encounter   Received notification that prior authorization for Dexcom G7 Sensor is required.   PA submitted on 06/12/2022 [facility] Electronic PA Form Status is pending

## 2022-06-16 ENCOUNTER — Other Ambulatory Visit (HOSPITAL_COMMUNITY): Payer: Self-pay

## 2022-06-18 ENCOUNTER — Other Ambulatory Visit (HOSPITAL_COMMUNITY): Payer: Self-pay

## 2022-06-26 NOTE — Telephone Encounter (Signed)
We received a fax in the office from [facility] that is attache under Media in the chart, that I am not sure is needed since her Dexcom was approved thru [employer]. If you guys could look at it and see if it needs to be filled out for her supplies or if they need to come from [facility].

## 2022-06-26 NOTE — Progress Notes (Addendum)
Annual Wellness Visit    Patient Care Team: [name], MD as PCP - General (Internal Medicine)  Visit Date: 07/03/22   Chief Complaint  Patient presents with   Medicare Wellness   Annual Exam    Subjective:   Patient: Lisa Robertson, Female    DOB: [birth year], 67 y.o.   MRN: [id number]  Shaelynn Burkemper is a 67 y.o. Female who presents today for her Annual Wellness Visit. She has a history of allergies, arthritis, Type 2 diabetes mellitus, adenomatous colonic polyp, hyperlipidemia, hypertension.  History of hypertension treated with dilitiazem 300 mg daily, hydrochlorothiazide 12.5 mg twice daily, losartan 100 mg daily. Blood pressure normal today at 128/78.  History of Type 2 diabetes mellitus treated with insulin glargine 50 units daily, metformin 500 mg twice daily with a meal. HGBA1c at 7.6% on 06/29/22, up from 7.3% on 11/18/21.  History of hyperlipidemia treated with simvastatin 20 mg daily in the evening. HDL low at 41, LDL slightly elevated at 100 on 06/29/22.  History of musculoskeletal pain treated with meloxicam 15 mg daily.  Denies depression, falls since last appointment.  TSH at 1.44 on 06/29/22. Glucose elevated at 159. AST elevated at 42, ALT at 41. Kidney function normal. CBC normal.  Status post abdominal hysterectomy 1998.  Mammogram last completed 04/30/22. No mammographic evidence of malignancy. Recommended repeat in 2025.  Colonoscopy last completed 01/31/18. Results showed one 3 mm polyp in ascending colon, removed, severe diverticulosis in sigmoid colon, narrowing of colon in association with diverticular opening. Examination otherwise normal. Pathology showed tubular adenoma. Recommended repeat in 2024.   DEXA scan last completed 06/16/21. T-score of -0.8 at left femur neck.   Past Medical History:  Diagnosis Date   Allergy    Arthritis    Colon polyp 10/16/2005   inflamed, Dr. Charlott Rakes   DM (diabetes mellitus)    Hx of  adenomatous polyp of colon 02/06/2018   Hyperlipemia    Hypertension      Family History  Problem Relation Age of Onset   Diabetes Sister        x2   Diabetes Mother    Diabetes Father    Heart disease Father    Prostate cancer Father    Diabetes Maternal Grandmother    Diabetes Paternal Grandmother    Colon cancer Neg Hx    Breast cancer Neg Hx    Esophageal cancer Neg Hx    Rectal cancer Neg Hx    Stomach cancer Neg Hx      Social History   Social History Narrative   Not on file     Review of Systems  Constitutional:  Negative for chills, fever, malaise/fatigue and weight loss.  HENT:  Negative for congestion, hearing loss, sinus pain and sore throat.   Eyes:  Negative for blurred vision.  Respiratory:  Negative for cough, hemoptysis and shortness of breath.   Cardiovascular:  Negative for chest pain, palpitations, leg swelling and PND.  Gastrointestinal:  Negative for abdominal pain, constipation, diarrhea, heartburn, nausea and vomiting.  Genitourinary:  Negative for dysuria, frequency and urgency.  Musculoskeletal:  Negative for back pain, myalgias and neck pain.  Skin:  Negative for itching and rash.  Neurological:  Negative for dizziness, tingling, seizures, loss of consciousness and headaches.  Endo/Heme/Allergies:  Negative for polydipsia.  Psychiatric/Behavioral:  Negative for depression. The patient is not nervous/anxious.       Objective:   Vitals: BP 128/78   Pulse  72   Temp 98.1 F (36.7 C) (Tympanic)   Ht 5' 3.5" (1.613 m)   Wt 171 lb (77.6 kg)   SpO2 98%   BMI 29.82 kg/m   Physical Exam Vitals and nursing note reviewed.  Constitutional:      General: She is not in acute distress.    Appearance: Normal appearance. She is not ill-appearing or toxic-appearing.  HENT:     Head: Normocephalic and atraumatic.     Right Ear: Hearing, tympanic membrane, ear canal and external ear normal.     Left Ear: Hearing, tympanic membrane, ear canal and  external ear normal.     Mouth/Throat:     Pharynx: Oropharynx is clear.  Eyes:     Extraocular Movements: Extraocular movements intact.     Pupils: Pupils are equal, round, and reactive to light.  Neck:     Thyroid: No thyroid mass, thyromegaly or thyroid tenderness.     Vascular: No carotid bruit.  Cardiovascular:     Rate and Rhythm: Normal rate and regular rhythm. No extrasystoles are present.    Pulses:          Dorsalis pedis pulses are 1+ on the right side and 1+ on the left side.     Heart sounds: Normal heart sounds. No murmur heard.    No friction rub. No gallop.  Pulmonary:     Effort: Pulmonary effort is normal.     Breath sounds: Normal breath sounds. No decreased breath sounds, wheezing, rhonchi or rales.  Chest:     Chest wall: No mass.  Abdominal:     Palpations: Abdomen is soft. There is no hepatomegaly, splenomegaly or mass.     Tenderness: There is no abdominal tenderness.     Hernia: No hernia is present.  Genitourinary:    Comments: No masses on bimanual exam.  Rectovaginal confirms. Musculoskeletal:     Cervical back: Normal range of motion.     Right lower leg: No edema.     Left lower leg: No edema.  Lymphadenopathy:     Cervical: No cervical adenopathy.     Upper Body:     Right upper body: No supraclavicular adenopathy.     Left upper body: No supraclavicular adenopathy.  Skin:    General: Skin is warm and dry.  Neurological:     General: No focal deficit present.     Mental Status: She is alert and oriented to person, place, and time. Mental status is at baseline.     Sensory: Sensation is intact.     Motor: Motor function is intact. No weakness.     Deep Tendon Reflexes: Reflexes are normal and symmetric.  Psychiatric:        Attention and Perception: Attention normal.        Mood and Affect: Mood normal.        Speech: Speech normal.        Behavior: Behavior normal.        Thought Content: Thought content normal.        Cognition and  Memory: Cognition normal.        Judgment: Judgment normal.      Most recent functional status assessment:    07/03/2022   10:12 AM  In your present state of health, do you have any difficulty performing the following activities:  Hearing? 0  Vision? 0  Difficulty concentrating or making decisions? 0  Walking or climbing stairs? 0  Dressing or bathing? 0  Doing errands, shopping? 0  Preparing Food and eating ? N  Using the Toilet? N  In the past six months, have you accidently leaked urine? N  Do you have problems with loss of bowel control? N  Managing your Medications? N  Managing your Finances? N  Housekeeping or managing your Housekeeping? N   Most recent fall risk assessment:    07/03/2022   10:11 AM  Fall Risk   Falls in the past year? 0  Number falls in past yr: 0  Injury with Fall? 0  Risk for fall due to : No Fall Risks  Follow up Falls prevention discussed    Most recent depression screenings:    07/03/2022   10:11 AM 11/20/2021    9:36 AM  PHQ 2/9 Scores  PHQ - 2 Score 0 0   Most recent cognitive screening:    07/03/2022   10:13 AM  6CIT Screen  What Year? 0 points  What month? 0 points  What time? 0 points  Count back from 20 0 points  Months in reverse 0 points  Repeat phrase 0 points  Total Score 0 points     Results:   Studies obtained and personally reviewed by me:  Mammogram last completed 04/30/22. No mammographic evidence of malignancy. Recommended repeat in 2025.  Colonoscopy last completed 01/31/18. Results showed one 3 mm polyp in ascending colon, removed, severe diverticulosis in sigmoid colon, narrowing of colon in association with diverticular opening. Examination otherwise normal. Pathology showed tubular adenoma. Recommended repeat in 2024.   DEXA scan last completed 06/16/21. T-score of -0.8 at left femur neck.   Labs:       Component Value Date/Time   NA 140 06/29/2022 0919   K 4.4 06/29/2022 0919   CL 102 06/29/2022 0919    CO2 26 06/29/2022 0919   GLUCOSE 159 (H) 06/29/2022 0919   BUN 19 06/29/2022 0919   CREATININE 0.81 06/29/2022 0919   CALCIUM 9.8 06/29/2022 0919   PROT 7.7 06/29/2022 0919   ALBUMIN 4.0 05/29/2016 0900   AST 42 (H) 06/29/2022 0919   ALT 41 (H) 06/29/2022 0919   ALKPHOS 52 05/29/2016 0900   BILITOT 0.4 06/29/2022 0919   GFRNONAA 86 06/14/2020 0930   GFRAA 99 06/14/2020 0930     Lab Results  Component Value Date   WBC 6.6 06/29/2022   HGB 13.4 06/29/2022   HCT 40.7 06/29/2022   MCV 87.5 06/29/2022   PLT 309 06/29/2022    Lab Results  Component Value Date   CHOL 163 06/29/2022   HDL 41 (L) 06/29/2022   LDLCALC 100 (H) 06/29/2022   TRIG 127 06/29/2022   CHOLHDL 4.0 06/29/2022    Lab Results  Component Value Date   HGBA1C 7.6 (H) 06/29/2022     Lab Results  Component Value Date   TSH 1.44 06/29/2022    Assessment & Plan:   Hypertension: treated with dilitiazem 300 mg daily, hydrochlorothiazide 12.5 mg twice daily, losartan 100 mg daily. Blood pressure normal today at 128/78.  Type 2 diabetes mellitus: treated with insulin glargine 50 units daily, metformin 500 mg twice daily with a meal. HGBA1c at 7.6% on 06/29/22, up from 7.3% on 11/18/21.  Due to diabetes, she is treated with  simvastatin 20 mg daily in the evening. HDL low at 41, LDL slightly elevated at 100 on 06/29/22.However, her lipids are normal. This is a preventative measure with her Diabetes  Musculoskeletal pain: treated with meloxicam 15 mg daily.  Hx left knee arthroplasty  Mild elevation of AST/ALT: AST elevated at 42, ALT at 41. Will continue to monitor.Likely due to fatty liver.  Mammogram: last completed 04/30/22. No mammographic evidence of malignancy. Recommended repeat in 2025.  Colonoscopy: last completed 01/31/18. Results showed one 3 mm polyp in ascending colon, removed, severe diverticulosis in sigmoid colon, narrowing of colon in association with diverticular opening. Examination  otherwise normal. Pathology showed tubular adenoma. Recommended repeat in 2024.   DEXA scan: last completed 06/16/21. T-score of -0.8 at left femur neck.  Vaccine counseling: UTD on flu, tetanus, shingles, Covid-19 vaccines. Administered pneumococcal 20 vaccine.  BMI 29- continue diet and exercise efforts  Return in 1 year for health maintenance exam.RTC in 6 months fro follow up on medical issues including Diabetes.     Annual wellness visit done today including the all of the following: Reviewed patient's Family Medical History Reviewed and updated list of patient's medical providers Assessment of cognitive impairment was done Assessed patient's functional ability Established a written schedule for health screening services Health Risk Assessent Completed and Reviewed  Discussed health benefits of physical activity, and encouraged her to engage in regular exercise appropriate for her age and condition.        I,Alexander Ruley,acting as a Neurosurgeon for [name], MD.,have documented all relevant documentation on the behalf of [name], MD,as directed by  [name], MD while in the presence of [name], MD.   I, [name], MD, have reviewed all documentation for this visit. The documentation on 07/05/22 for the exam, diagnosis, procedures, and orders are all accurate and complete.

## 2022-06-29 ENCOUNTER — Other Ambulatory Visit (HOSPITAL_COMMUNITY): Payer: Self-pay

## 2022-06-29 ENCOUNTER — Other Ambulatory Visit: Payer: Medicare HMO

## 2022-06-29 DIAGNOSIS — I10 Essential (primary) hypertension: Secondary | ICD-10-CM | POA: Diagnosis not present

## 2022-06-29 DIAGNOSIS — Z13220 Encounter for screening for lipoid disorders: Secondary | ICD-10-CM

## 2022-06-29 DIAGNOSIS — R5383 Other fatigue: Secondary | ICD-10-CM

## 2022-06-29 DIAGNOSIS — E119 Type 2 diabetes mellitus without complications: Secondary | ICD-10-CM

## 2022-06-29 DIAGNOSIS — Z0000 Encounter for general adult medical examination without abnormal findings: Secondary | ICD-10-CM | POA: Diagnosis not present

## 2022-06-29 DIAGNOSIS — Z794 Long term (current) use of insulin: Secondary | ICD-10-CM | POA: Diagnosis not present

## 2022-06-30 LAB — HEMOGLOBIN A1C
Hgb A1c MFr Bld: 7.6 % of total Hgb — ABNORMAL HIGH (ref <5.7)
Mean Plasma Glucose: 171 mg/dL
eAG (mmol/L): 9.5 mmol/L

## 2022-06-30 LAB — LIPID PANEL
Cholesterol: 163 mg/dL (ref <200)
HDL: 41 mg/dL — ABNORMAL LOW (ref >=50)
LDL Cholesterol (Calc): 100 mg/dL (calc) — ABNORMAL HIGH
Non-HDL Cholesterol (Calc): 122 mg/dL (calc) (ref <130)
Total CHOL/HDL Ratio: 4 (calc) (ref <5.0)
Triglycerides: 127 mg/dL (ref <150)

## 2022-06-30 LAB — CBC WITH DIFFERENTIAL/PLATELET
Absolute Monocytes: 521 cells/uL (ref 200–950)
Basophils Absolute: 33 cells/uL (ref 0–200)
Basophils Relative: 0.5 %
Eosinophils Absolute: 442 cells/uL (ref 15–500)
Eosinophils Relative: 6.7 %
HCT: 40.7 % (ref 35.0–45.0)
Hemoglobin: 13.4 g/dL (ref 11.7–15.5)
Lymphs Abs: 3102 cells/uL (ref 850–3900)
MCH: 28.8 pg (ref 27.0–33.0)
MCHC: 32.9 g/dL (ref 32.0–36.0)
MCV: 87.5 fL (ref 80.0–100.0)
MPV: 10.3 fL (ref 7.5–12.5)
Monocytes Relative: 7.9 %
Neutro Abs: 2501 cells/uL (ref 1500–7800)
Neutrophils Relative %: 37.9 %
Platelets: 309 10*3/uL (ref 140–400)
RBC: 4.65 10*6/uL (ref 3.80–5.10)
RDW: 13.2 % (ref 11.0–15.0)
Total Lymphocyte: 47 %
WBC: 6.6 10*3/uL (ref 3.8–10.8)

## 2022-06-30 LAB — COMPLETE METABOLIC PANEL WITH GFR
AG Ratio: 1.1 (calc) (ref 1.0–2.5)
ALT: 41 U/L — ABNORMAL HIGH (ref 6–29)
AST: 42 U/L — ABNORMAL HIGH (ref 10–35)
Albumin: 4.1 g/dL (ref 3.6–5.1)
Alkaline phosphatase (APISO): 55 U/L (ref 37–153)
BUN: 19 mg/dL (ref 7–25)
CO2: 26 mmol/L (ref 20–32)
Calcium: 9.8 mg/dL (ref 8.6–10.4)
Chloride: 102 mmol/L (ref 98–110)
Creat: 0.81 mg/dL (ref 0.50–1.05)
Globulin: 3.6 g/dL (calc) (ref 1.9–3.7)
Glucose, Bld: 159 mg/dL — ABNORMAL HIGH (ref 65–99)
Potassium: 4.4 mmol/L (ref 3.5–5.3)
Sodium: 140 mmol/L (ref 135–146)
Total Bilirubin: 0.4 mg/dL (ref 0.2–1.2)
Total Protein: 7.7 g/dL (ref 6.1–8.1)
eGFR: 79 mL/min/{1.73_m2} (ref >=60)

## 2022-06-30 LAB — MICROALBUMIN / CREATININE URINE RATIO
Creatinine, Urine: 102 mg/dL (ref 20–275)
Microalb Creat Ratio: 19 mg/g creat (ref <30)
Microalb, Ur: 1.9 mg/dL

## 2022-06-30 LAB — TSH: TSH: 1.44 mIU/L (ref 0.40–4.50)

## 2022-07-01 ENCOUNTER — Other Ambulatory Visit (HOSPITAL_COMMUNITY): Payer: Self-pay

## 2022-07-03 ENCOUNTER — Encounter: Payer: Self-pay | Admitting: Internal Medicine

## 2022-07-03 ENCOUNTER — Ambulatory Visit (INDEPENDENT_AMBULATORY_CARE_PROVIDER_SITE_OTHER): Payer: Medicare HMO | Admitting: Internal Medicine

## 2022-07-03 VITALS — BP 128/78 | HR 72 | Temp 98.1°F | Ht 63.5 in | Wt 171.0 lb

## 2022-07-03 DIAGNOSIS — Z794 Long term (current) use of insulin: Secondary | ICD-10-CM | POA: Diagnosis not present

## 2022-07-03 DIAGNOSIS — I10 Essential (primary) hypertension: Secondary | ICD-10-CM

## 2022-07-03 DIAGNOSIS — Z0000 Encounter for general adult medical examination without abnormal findings: Secondary | ICD-10-CM | POA: Diagnosis not present

## 2022-07-03 DIAGNOSIS — Z23 Encounter for immunization: Secondary | ICD-10-CM

## 2022-07-03 DIAGNOSIS — Z96652 Presence of left artificial knee joint: Secondary | ICD-10-CM

## 2022-07-03 DIAGNOSIS — E8881 Metabolic syndrome: Secondary | ICD-10-CM

## 2022-07-03 DIAGNOSIS — E119 Type 2 diabetes mellitus without complications: Secondary | ICD-10-CM | POA: Diagnosis not present

## 2022-07-03 DIAGNOSIS — Z6829 Body mass index (BMI) 29.0-29.9, adult: Secondary | ICD-10-CM | POA: Diagnosis not present

## 2022-07-03 DIAGNOSIS — D126 Benign neoplasm of colon, unspecified: Secondary | ICD-10-CM

## 2022-07-03 LAB — POCT URINALYSIS DIPSTICK
Bilirubin, UA: NEGATIVE
Blood, UA: NEGATIVE
Glucose, UA: NEGATIVE
Ketones, UA: NEGATIVE
Leukocytes, UA: NEGATIVE
Nitrite, UA: NEGATIVE
Protein, UA: NEGATIVE
Spec Grav, UA: 1.01 (ref 1.010–1.025)
Urobilinogen, UA: 0.2 E.U./dL
pH, UA: 7 (ref 5.0–8.0)

## 2022-07-03 NOTE — Patient Instructions (Signed)
Patient anticipating getting Dexcom meter from [facility] in the near future. RTC in 6 months for follow up. Call if you have questions in the meantime. No change in meds. Watch diet and continue to exercise. It was a pleasure to see you today.

## 2022-07-05 DIAGNOSIS — Z6829 Body mass index (BMI) 29.0-29.9, adult: Secondary | ICD-10-CM | POA: Insufficient documentation

## 2022-07-05 DIAGNOSIS — Z96652 Presence of left artificial knee joint: Secondary | ICD-10-CM | POA: Insufficient documentation

## 2022-07-07 ENCOUNTER — Other Ambulatory Visit (HOSPITAL_COMMUNITY): Payer: Self-pay

## 2022-07-08 ENCOUNTER — Other Ambulatory Visit: Payer: Self-pay

## 2022-07-08 ENCOUNTER — Other Ambulatory Visit (HOSPITAL_COMMUNITY): Payer: Self-pay

## 2022-07-08 DIAGNOSIS — E119 Type 2 diabetes mellitus without complications: Secondary | ICD-10-CM

## 2022-07-08 MED ORDER — ACCU-CHEK SAFE-T PRO LANCETS MISC
3 refills | Status: DC
  Filled 2022-07-08 (×2): qty 300, 75d supply, fill #0
  Filled 2022-07-08: qty 400, 90d supply, fill #0
  Filled 2023-07-07: qty 300, 75d supply, fill #1

## 2022-07-09 ENCOUNTER — Other Ambulatory Visit: Payer: Self-pay

## 2022-07-09 ENCOUNTER — Other Ambulatory Visit (HOSPITAL_COMMUNITY): Payer: Self-pay

## 2022-07-09 NOTE — Telephone Encounter (Signed)
Spoke with pharmacy staff and Rx issue was resolved on yesterday.

## 2022-07-10 ENCOUNTER — Telehealth: Payer: Self-pay

## 2022-07-10 ENCOUNTER — Other Ambulatory Visit (HOSPITAL_COMMUNITY): Payer: Self-pay

## 2022-07-10 DIAGNOSIS — E119 Type 2 diabetes mellitus without complications: Secondary | ICD-10-CM

## 2022-07-10 MED ORDER — DEXCOM G7 RECEIVER DEVI
0 refills | Status: DC
  Filled 2022-07-10: qty 1, 90d supply, fill #0
  Filled 2022-07-10: qty 1, 365d supply, fill #0

## 2022-07-10 NOTE — Telephone Encounter (Signed)
Dexcom G7

## 2022-07-13 ENCOUNTER — Other Ambulatory Visit (HOSPITAL_COMMUNITY): Payer: Self-pay

## 2022-07-22 ENCOUNTER — Other Ambulatory Visit (HOSPITAL_COMMUNITY): Payer: Self-pay

## 2022-07-23 ENCOUNTER — Other Ambulatory Visit (HOSPITAL_COMMUNITY): Payer: Self-pay

## 2022-08-05 ENCOUNTER — Other Ambulatory Visit (HOSPITAL_COMMUNITY): Payer: Self-pay

## 2022-08-05 ENCOUNTER — Other Ambulatory Visit: Payer: Self-pay

## 2022-08-06 ENCOUNTER — Other Ambulatory Visit (HOSPITAL_COMMUNITY): Payer: Self-pay

## 2022-08-13 ENCOUNTER — Other Ambulatory Visit: Payer: Self-pay

## 2022-08-27 ENCOUNTER — Other Ambulatory Visit: Payer: Self-pay

## 2022-08-27 ENCOUNTER — Other Ambulatory Visit (HOSPITAL_COMMUNITY): Payer: Self-pay

## 2022-08-27 ENCOUNTER — Other Ambulatory Visit: Payer: Self-pay | Admitting: Internal Medicine

## 2022-08-27 MED ORDER — MELOXICAM 15 MG PO TABS
15.0000 mg | ORAL_TABLET | Freq: Every day | ORAL | 1 refills | Status: DC
  Filled 2022-08-27: qty 90, 90d supply, fill #0
  Filled 2022-11-23: qty 90, 90d supply, fill #1

## 2022-08-28 ENCOUNTER — Other Ambulatory Visit: Payer: Self-pay

## 2022-09-30 ENCOUNTER — Encounter: Payer: Self-pay | Admitting: Internal Medicine

## 2022-10-05 ENCOUNTER — Encounter: Payer: Self-pay | Admitting: Internal Medicine

## 2022-10-05 ENCOUNTER — Other Ambulatory Visit (HOSPITAL_COMMUNITY): Payer: Self-pay

## 2022-10-05 ENCOUNTER — Other Ambulatory Visit: Payer: Self-pay | Admitting: Internal Medicine

## 2022-10-05 ENCOUNTER — Other Ambulatory Visit: Payer: Self-pay

## 2022-10-05 MED ORDER — KETOCONAZOLE 2 % EX CREA
1.0000 | TOPICAL_CREAM | Freq: Every day | CUTANEOUS | 99 refills | Status: DC
  Filled 2022-10-05: qty 15, 15d supply, fill #0
  Filled 2022-11-23: qty 15, 15d supply, fill #1
  Filled 2023-02-01: qty 15, 15d supply, fill #2
  Filled 2023-04-12: qty 30, 30d supply, fill #3
  Filled 2023-04-12: qty 15, 15d supply, fill #3
  Filled 2023-05-26: qty 30, 30d supply, fill #4
  Filled 2023-09-14: qty 30, 30d supply, fill #5

## 2022-10-06 ENCOUNTER — Other Ambulatory Visit: Payer: Self-pay

## 2022-10-12 ENCOUNTER — Other Ambulatory Visit: Payer: Self-pay

## 2022-10-12 ENCOUNTER — Encounter: Payer: Self-pay | Admitting: Internal Medicine

## 2022-10-12 ENCOUNTER — Telehealth: Payer: Self-pay | Admitting: Internal Medicine

## 2022-10-12 ENCOUNTER — Ambulatory Visit (INDEPENDENT_AMBULATORY_CARE_PROVIDER_SITE_OTHER): Payer: Medicare HMO | Admitting: Internal Medicine

## 2022-10-12 VITALS — BP 152/84 | HR 88 | Temp 98.8°F | Ht 63.5 in | Wt 168.0 lb

## 2022-10-12 DIAGNOSIS — R112 Nausea with vomiting, unspecified: Secondary | ICD-10-CM | POA: Diagnosis not present

## 2022-10-12 DIAGNOSIS — Z794 Long term (current) use of insulin: Secondary | ICD-10-CM | POA: Diagnosis not present

## 2022-10-12 DIAGNOSIS — E162 Hypoglycemia, unspecified: Secondary | ICD-10-CM | POA: Diagnosis not present

## 2022-10-12 DIAGNOSIS — E119 Type 2 diabetes mellitus without complications: Secondary | ICD-10-CM | POA: Diagnosis not present

## 2022-10-12 LAB — POCT URINALYSIS DIPSTICK
Bilirubin, UA: NEGATIVE
Blood, UA: NEGATIVE
Glucose, UA: NEGATIVE
Leukocytes, UA: NEGATIVE
Nitrite, UA: NEGATIVE
Protein, UA: NEGATIVE
Spec Grav, UA: <=1.005 — AB (ref 1.010–1.025)
Urobilinogen, UA: NEGATIVE E.U./dL — AB
pH, UA: 7.5 (ref 5.0–8.0)

## 2022-10-12 NOTE — Telephone Encounter (Signed)
Lisa Robertson called to say that her blood sugars have been up and down this weekend. Friday night it was 269 and Saturday night 278 and last night it was 47 she felt really bad and was throwing up. She does have the Dexcom. She is wanting to come in today or tomorrow.

## 2022-10-12 NOTE — Progress Notes (Signed)
Patient Care Team: [name], MD as PCP - General (Internal Medicine)  Visit Date: 10/12/22  Subjective:    Patient ID: Lisa Robertson , Female   DOB: September 08, 1955, 67 y.o.    MRN: [id number]   67 y.o. Female presents today for hypoglycemia. She ate a large amount of fruit on 10/09/22 and her blood glucose rose to 257 that night. Blood glucose was 47 after dinner on 10/11/22. She had nausea and vomiting that night that resolved this morning. She does not take anything for hypoglycemia. Blood glucose at 119 this morning. She is feeling normal now. Denies nausea, vomiting, diarrhea. Has not had a similar episode in some time. History of Type 2 diabetes. She is taking Novolog 12 units three times daily, Lantus 50 units daily, metformin 500 mg twice daily with a meal.Patient has longstanding hx of IDDM.  Past Medical History:  Diagnosis Date   Allergy    Arthritis    Colon polyp 10/16/2005   inflamed, Dr. Charlott Rakes   DM (diabetes mellitus) [facility])    Hx of adenomatous polyp of colon 02/06/2018   Hyperlipemia    Hypertension      Family History  Problem Relation Age of Onset   Diabetes Sister        x2   Diabetes Mother    Diabetes Father    Heart disease Father    Prostate cancer Father    Diabetes Maternal Grandmother    Diabetes Paternal Grandmother    Colon cancer Neg Hx    Breast cancer Neg Hx    Esophageal cancer Neg Hx    Rectal cancer Neg Hx    Stomach cancer Neg Hx     Social Hx: Married. Has adult children.Does not smoke or consume alcohol.     Review of Systems  Constitutional:  Negative for fever and malaise/fatigue.  HENT:  Negative for congestion.   Eyes:  Negative for blurred vision.  Respiratory:  Negative for cough and shortness of breath.   Cardiovascular:  Negative for chest pain, palpitations and leg swelling.  Gastrointestinal:  Negative for vomiting.  Musculoskeletal:  Negative for back pain.  Skin:  Negative for rash.   Neurological:  Negative for loss of consciousness and headaches.        Objective:   Vitals: BP (!) 152/84 (BP Location: Left Arm)   Pulse 88   Temp 98.8 F (37.1 C)   Ht 5' 3.5" (1.613 m)   Wt 168 lb (76.2 kg)   BMI 29.29 kg/m    Physical Exam Vitals and nursing note reviewed.  Constitutional:      General: She is not in acute distress.    Appearance: Normal appearance. She is not toxic-appearing.  HENT:     Head: Normocephalic and atraumatic.  Cardiovascular:     Rate and Rhythm: Normal rate and regular rhythm. No extrasystoles are present.    Pulses: Normal pulses.     Heart sounds: Normal heart sounds. No murmur heard.    No friction rub. No gallop.  Pulmonary:     Effort: Pulmonary effort is normal. No respiratory distress.     Breath sounds: Normal breath sounds. No wheezing or rales.  Skin:    General: Skin is warm and dry.  Neurological:     Mental Status: She is alert and oriented to person, place, and time. Mental status is at baseline.  Psychiatric:        Mood and Affect: Mood normal.  Behavior: Behavior normal.        Thought Content: Thought content normal.        Judgment: Judgment normal.       Results:   Studies obtained and personally reviewed by me:   Labs:       Component Value Date/Time   NA 140 06/29/2022 0919   K 4.4 06/29/2022 0919   CL 102 06/29/2022 0919   CO2 26 06/29/2022 0919   GLUCOSE 159 (H) 06/29/2022 0919   BUN 19 06/29/2022 0919   CREATININE 0.81 06/29/2022 0919   CALCIUM 9.8 06/29/2022 0919   PROT 7.7 06/29/2022 0919   ALBUMIN 4.0 05/29/2016 0900   AST 42 (H) 06/29/2022 0919   ALT 41 (H) 06/29/2022 0919   ALKPHOS 52 05/29/2016 0900   BILITOT 0.4 06/29/2022 0919   GFRNONAA 86 06/14/2020 0930   GFRAA 99 06/14/2020 0930     Lab Results  Component Value Date   WBC 6.6 06/29/2022   HGB 13.4 06/29/2022   HCT 40.7 06/29/2022   MCV 87.5 06/29/2022   PLT 309 06/29/2022    Lab Results  Component Value  Date   CHOL 163 06/29/2022   HDL 41 (L) 06/29/2022   LDLCALC 100 (H) 06/29/2022   TRIG 127 06/29/2022   CHOLHDL 4.0 06/29/2022    Lab Results  Component Value Date   HGBA1C 7.6 (H) 06/29/2022     Lab Results  Component Value Date   TSH 1.44 06/29/2022      Assessment & Plan:   Hypoglycemia: Likely explains symptoms she was having. she did not try to treat low sugar with glucose or sugar pills.  Feels much better today and Dexcom meter shows stable readings.  Urinalysis showed trace ketones. Continue current diabetes regimen. Advised on eating dinner from 5-7pm with a snack at 9pm. She is not interested in seeing a Dietitian. We will check on her referral for endocrinology. Will continue to monitor. Feels better today. Dipstick urine shows no LE, Nitrite,blood or glucose    I,Alexander Ruley,acting as a scribe for [name], MD.,have documented all relevant documentation on the behalf of [name], MD,as directed by  [name], MD while in the presence of [name], MD.   I, [name], MD, have reviewed all documentation for this visit. The documentation on 10/14/22 for the exam, diagnosis, procedures, and orders are all accurate and complete.IMargaree Mackintosh, MD, have reviewed all documentation for this visit. The documentation on 10/14/22 for the exam, diagnosis, procedures, and orders are all accurate and complete.

## 2022-10-12 NOTE — Patient Instructions (Signed)
Patient has negative urine dipstick. Feels fine now and glucose has stabilized. We have been waiting on an Endocrine Appt for some time now. Will check on this. Call  us if symptoms recure.

## 2022-10-12 NOTE — Telephone Encounter (Signed)
scheduled

## 2022-10-16 ENCOUNTER — Telehealth: Payer: Self-pay | Admitting: Internal Medicine

## 2022-10-16 ENCOUNTER — Other Ambulatory Visit (HOSPITAL_COMMUNITY): Payer: Self-pay

## 2022-10-16 ENCOUNTER — Telehealth: Payer: Medicare HMO | Admitting: Internal Medicine

## 2022-10-16 ENCOUNTER — Encounter: Payer: Self-pay | Admitting: Internal Medicine

## 2022-10-16 VITALS — BP 141/84 | Temp 98.6°F | Ht 63.5 in | Wt 168.0 lb

## 2022-10-16 DIAGNOSIS — U071 COVID-19: Secondary | ICD-10-CM | POA: Diagnosis not present

## 2022-10-16 DIAGNOSIS — I10 Essential (primary) hypertension: Secondary | ICD-10-CM

## 2022-10-16 DIAGNOSIS — E119 Type 2 diabetes mellitus without complications: Secondary | ICD-10-CM

## 2022-10-16 MED ORDER — BENZONATATE 100 MG PO CAPS
100.0000 mg | ORAL_CAPSULE | Freq: Three times a day (TID) | ORAL | 0 refills | Status: DC | PRN
  Filled 2022-10-16: qty 30, 10d supply, fill #0

## 2022-10-16 MED ORDER — AZITHROMYCIN 250 MG PO TABS
ORAL_TABLET | ORAL | 0 refills | Status: AC
  Filled 2022-10-16: qty 6, 5d supply, fill #0

## 2022-10-16 NOTE — Telephone Encounter (Signed)
Lisa Robertson [phone]  Renate called to say she was up all night coughing and sneezing. She said the cough started on Tuesday or Wednesday. I ask her to do a COVID test and call me back.

## 2022-10-16 NOTE — Telephone Encounter (Signed)
Lisa Robertson called me back and she tested positive for COVID. I have set her up for Video visit at 12:00, also ask her to have vitals ready for CMA when she calls.

## 2022-10-16 NOTE — Progress Notes (Signed)
Patient Care Team: [name], MD as PCP - General (Internal Medicine)  I connected with Lisa Robertson on 10/16/22 at 12:07 PM by video enabled telemedicine visit and verified that I am speaking with the correct person using two identifiers.   I discussed the limitations, risks, security and privacy concerns of performing an evaluation and management service by telemedicine and the availability of in-person appointments. I also discussed with the patient that there may be a patient responsible charge related to this service. The patient expressed understanding and agreed to proceed.   Other persons participating in the visit and their role in the encounter: Medical scribe, Lisa Robertson  Patient's location: Home  Provider's location: Clinic   I provided 20 minutes of face-to-face video visit time during this encounter, and > 50% was spent counseling as documented under my assessment & plan. She is identified using two identifiers, Lisa Robertson, a patient of this practice. She is in her home and I am in my practice. She is agreeable to using this format today.  Chief Complaint: cough, sneezing   Subjective:    Patient ID: Lisa Robertson , Female    DOB: 08-Dec-1955, 67 y.o.    MRN: [id number]   67 y.o. Female presents today for cough, congestion and sneezing since 10/12/22. Symptoms were initially mild but have worsened. Denies sputum production. She attended a large church event on 10/09/22. At-home Covid-19 test positive.  Past Medical History:  Diagnosis Date   Allergy    Arthritis    Colon polyp 10/16/2005   inflamed, Dr. Charlott Robertson   DM (diabetes mellitus) [facility])    Hx of adenomatous polyp of colon 02/06/2018   Hyperlipemia    Hypertension      Family History  Problem Relation Age of Onset   Diabetes Sister        x2   Diabetes Mother    Diabetes Father    Heart disease Father    Prostate cancer Father    Diabetes Maternal Grandmother    Diabetes  Paternal Grandmother    Colon cancer Neg Hx    Breast cancer Neg Hx    Esophageal cancer Neg Hx    Rectal cancer Neg Hx    Stomach cancer Neg Hx     Social Hx: Married.  2 sons and 1 daughter.  Does not smoke or consume alcohol.  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  Husband retired as an Educational psychologist at [employer].  Family history: Son with history of kidney transplant from uncontrolled hypertension.  Father with history of hypertension, prostate cancer, diabetes along with heart disease.  Mother died of a stroke in 2016/11/10 with history of diabetes and hypertension.  3 sisters and brothers.     Review of Systems  Constitutional:  Negative for fever and malaise/fatigue.  HENT:  Positive for congestion.        (+) Sneezing  Eyes:  Negative for blurred vision.  Respiratory:  Positive for cough. Negative for sputum production and shortness of breath.   Cardiovascular:  Negative for chest pain, palpitations and leg swelling.  Gastrointestinal:  Negative for vomiting.  Musculoskeletal:  Negative for back pain.  Skin:  Negative for rash.  Neurological:  Negative for loss of consciousness and headaches.        Objective:   Vitals: BP (!) 141/84   Temp 98.6 F (37 C) (Temporal)   Ht 5' 3.5" (1.613 m)  Wt 168 lb (76.2 kg)   BMI 29.29 kg/m    Physical Exam Vitals and nursing note reviewed.  Constitutional:      General: She is not in acute distress.    Appearance: Normal appearance. She is not toxic-appearing.  HENT:     Head: Normocephalic and atraumatic.  Pulmonary:     Effort: Pulmonary effort is normal.  Skin:    General: Skin is warm and dry.  Neurological:     Mental Status: She is alert and oriented to person, place, and time. Mental status is at baseline.  Psychiatric:        Mood and Affect: Mood normal.        Behavior: Behavior normal.        Thought Content: Thought content normal.        Judgment: Judgment normal.      He is not tachypneic on video visit.  He is not heard to be coughing.  He is alert and oriented and in no acute distress.  Results:   Studies obtained and personally reviewed by me:   Labs:       Component Value Date/Time   NA 140 06/29/2022 0919   K 4.4 06/29/2022 0919   CL 102 06/29/2022 0919   CO2 26 06/29/2022 0919   GLUCOSE 159 (H) 06/29/2022 0919   BUN 19 06/29/2022 0919   CREATININE 0.81 06/29/2022 0919   CALCIUM 9.8 06/29/2022 0919   PROT 7.7 06/29/2022 0919   ALBUMIN 4.0 05/29/2016 0900   AST 42 (H) 06/29/2022 0919   ALT 41 (H) 06/29/2022 0919   ALKPHOS 52 05/29/2016 0900   BILITOT 0.4 06/29/2022 0919   GFRNONAA 86 06/14/2020 0930   GFRAA 99 06/14/2020 0930     Lab Results  Component Value Date   WBC 6.6 06/29/2022   HGB 13.4 06/29/2022   HCT 40.7 06/29/2022   MCV 87.5 06/29/2022   PLT 309 06/29/2022    Lab Results  Component Value Date   CHOL 163 06/29/2022   HDL 41 (L) 06/29/2022   LDLCALC 100 (H) 06/29/2022   TRIG 127 06/29/2022   CHOLHDL 4.0 06/29/2022    Lab Results  Component Value Date   HGBA1C 7.6 (H) 06/29/2022     Lab Results  Component Value Date   TSH 1.44 06/29/2022      Assessment & Plan:   Acute Covid-19 infection: prescribed Z-Pak two tabs day 1 followed by one tab days 2-5, Tessalon 100 mg three times daily as needed for cough. Rest well, eat and drink well and walk regularly. Call if new or worsening symptoms develop.    I,Alexander Ruley,acting as a Neurosurgeon for [name], MD.,have documented all relevant documentation on the behalf of [name], MD,as directed by  [name], MD while in the presence of [name], MD.   I, [name], MD, have reviewed all documentation for this visit. The documentation on 10/16/22 for the exam, diagnosis, procedures, and orders are all accurate and complete.

## 2022-10-16 NOTE — Patient Instructions (Signed)
Patient will take Zithromax Z-PAK 2 tabs day 1 followed by 1 tab days 2 through 5.  She declines Paxlovid.  May take Tessalon Perles 100 mg up to 3 times daily as needed for cough.  Rest and stay well-hydrated.  May monitor pulse oximetry.  Walk some to prevent atelectasis.  Call if you have any questions or concerns.  We are sorry you are not feeling well today.

## 2022-11-03 ENCOUNTER — Other Ambulatory Visit: Payer: Self-pay

## 2022-11-13 ENCOUNTER — Other Ambulatory Visit (HOSPITAL_COMMUNITY): Payer: Self-pay

## 2022-11-23 ENCOUNTER — Other Ambulatory Visit: Payer: Self-pay

## 2022-12-23 ENCOUNTER — Other Ambulatory Visit (HOSPITAL_COMMUNITY): Payer: Self-pay

## 2022-12-23 ENCOUNTER — Other Ambulatory Visit: Payer: Self-pay

## 2022-12-24 ENCOUNTER — Other Ambulatory Visit: Payer: Self-pay

## 2022-12-24 ENCOUNTER — Other Ambulatory Visit (HOSPITAL_COMMUNITY): Payer: Self-pay

## 2022-12-24 MED ORDER — AMOXICILLIN 500 MG PO CAPS
2000.0000 mg | ORAL_CAPSULE | ORAL | 0 refills | Status: AC
  Filled 2022-12-24 (×2): qty 8, 2d supply, fill #0

## 2022-12-25 ENCOUNTER — Telehealth: Payer: Self-pay

## 2022-12-25 ENCOUNTER — Other Ambulatory Visit: Payer: Self-pay

## 2022-12-25 ENCOUNTER — Other Ambulatory Visit (HOSPITAL_COMMUNITY): Payer: Self-pay

## 2022-12-25 NOTE — Telephone Encounter (Signed)
Pharmacy Patient Advocate Encounter   Received notification from CoverMyMeds that prior authorization for Dexcom G7 Sensor is required/requested.   Insurance verification completed.   The patient is insured through [facility] .   Per test claim: PA required; PA submitted to [facility] via CoverMyMeds Key/confirmation #/EOC Key: [id number] Status is pending

## 2022-12-29 ENCOUNTER — Other Ambulatory Visit: Payer: Self-pay

## 2022-12-30 ENCOUNTER — Other Ambulatory Visit (HOSPITAL_COMMUNITY): Payer: Self-pay

## 2023-01-01 NOTE — Progress Notes (Signed)
Patient Care Team: [name], MD as PCP - General (Internal Medicine)  Visit Date: 01/08/23  Subjective:    Patient ID: Lisa Robertson , Female   DOB: Aug 13, 1955, 67 y.o.    MRN: [id number]   67 y.o. Female presents today for a 40-month follow-up. She has a history of allergies, arthritis, Type 2 diabetes mellitus, adenomatous colonic polyp, hyperlipidemia, hypertension.   History of hypertension treated with dilitiazem 300 mg daily, hydrochlorothiazide 12.5 mg twice daily, losartan 100 mg daily. Blood pressure elevated today at 160/70. She has been compliant with her medications and has taken them this morning. She has not been monitoring her blood pressure at home.   History of Type 2 diabetes mellitus treated with insulin glargine 50 units daily, metformin 500 mg twice daily with a meal. HGBA1c at 7.6% on 01/05/23, no change from 06/29/22.  History of hyperlipidemia treated with simvastatin 20 mg daily in the evening. HDL low at 44, TRIG elevated at 157, LDL elevated at 103. AST elevated at 64, ALT elevated at 64 on 01/05/23, up from 42 and 41 on 06/29/22.   History of musculoskeletal pain treated with meloxicam 15 mg daily.  Status post abdominal hysterectomy 1998.   Mammogram last completed 04/30/22. No mammographic evidence of malignancy. Recommended repeat in 2025.   Colonoscopy last completed 01/31/18. Results showed one 3 mm polyp in ascending colon, removed, severe diverticulosis in sigmoid colon, narrowing of colon in association with diverticular opening. Examination otherwise normal. Pathology showed tubular adenoma. Recommended repeat in 2024.    DEXA scan last completed 06/16/21. T-score of -0.8 at left femur neck.  Past Medical History:  Diagnosis Date   Allergy    Arthritis    Colon polyp 10/16/2005   inflamed, Dr. Charlott Rakes   DM (diabetes mellitus) [facility])    Hx of adenomatous polyp of colon 02/06/2018   Hyperlipemia    Hypertension      Family  History  Problem Relation Age of Onset   Diabetes Sister        x2   Diabetes Mother    Diabetes Father    Heart disease Father    Prostate cancer Father    Diabetes Maternal Grandmother    Diabetes Paternal Grandmother    Colon cancer Neg Hx    Breast cancer Neg Hx    Esophageal cancer Neg Hx    Rectal cancer Neg Hx    Stomach cancer Neg Hx     Social History   Social History Narrative   Not on file      Review of Systems  Constitutional:  Negative for fever and malaise/fatigue.  HENT:  Negative for congestion.   Eyes:  Negative for blurred vision.  Respiratory:  Negative for cough and shortness of breath.   Cardiovascular:  Negative for chest pain, palpitations and leg swelling.  Gastrointestinal:  Negative for vomiting.  Musculoskeletal:  Negative for back pain.  Skin:  Negative for rash.  Neurological:  Negative for loss of consciousness and headaches.        Objective:   Vitals: BP (!) 160/70   Pulse 78   Ht 5' 3.5" (1.613 m)   Wt 173 lb (78.5 kg)   SpO2 98%   BMI 30.16 kg/m    Physical Exam Vitals and nursing note reviewed.  Constitutional:      General: She is not in acute distress.    Appearance: Normal appearance. She is not toxic-appearing.  HENT:  Head: Normocephalic and atraumatic.  Cardiovascular:     Rate and Rhythm: Normal rate and regular rhythm. No extrasystoles are present.    Pulses: Normal pulses.     Heart sounds: Normal heart sounds. No murmur heard.    No friction rub. No gallop.  Pulmonary:     Effort: Pulmonary effort is normal. No respiratory distress.     Breath sounds: Normal breath sounds. No wheezing or rales.  Musculoskeletal:     Right lower leg: No edema.     Left lower leg: No edema.  Skin:    General: Skin is warm and dry.  Neurological:     Mental Status: She is alert and oriented to person, place, and time. Mental status is at baseline.  Psychiatric:        Mood and Affect: Mood normal.        Behavior:  Behavior normal.        Thought Content: Thought content normal.        Judgment: Judgment normal.       Results:   Studies obtained and personally reviewed by me:  Mammogram last completed 04/30/22. No mammographic evidence of malignancy. Recommended repeat in 2025.   Colonoscopy last completed 01/31/18. Results showed one 3 mm polyp in ascending colon, removed, severe diverticulosis in sigmoid colon, narrowing of colon in association with diverticular opening. Examination otherwise normal. Pathology showed tubular adenoma. Recommended repeat in 2024.    DEXA scan last completed 06/16/21. T-score of -0.8 at left femur neck.  Labs:       Component Value Date/Time   NA 140 06/29/2022 0919   K 4.4 06/29/2022 0919   CL 102 06/29/2022 0919   CO2 26 06/29/2022 0919   GLUCOSE 159 (H) 06/29/2022 0919   BUN 19 06/29/2022 0919   CREATININE 0.81 06/29/2022 0919   CALCIUM 9.8 06/29/2022 0919   PROT 7.8 01/05/2023 0933   ALBUMIN 4.0 05/29/2016 0900   AST 64 (H) 01/05/2023 0933   ALT 64 (H) 01/05/2023 0933   ALKPHOS 52 05/29/2016 0900   BILITOT 0.4 01/05/2023 0933   GFRNONAA 86 06/14/2020 0930   GFRAA 99 06/14/2020 0930     Lab Results  Component Value Date   WBC 6.6 06/29/2022   HGB 13.4 06/29/2022   HCT 40.7 06/29/2022   MCV 87.5 06/29/2022   PLT 309 06/29/2022    Lab Results  Component Value Date   CHOL 172 01/05/2023   HDL 44 (L) 01/05/2023   LDLCALC 103 (H) 01/05/2023   TRIG 157 (H) 01/05/2023   CHOLHDL 3.9 01/05/2023    Lab Results  Component Value Date   HGBA1C 7.6 (H) 01/05/2023     Lab Results  Component Value Date   TSH 1.44 06/29/2022      Assessment & Plan:   Hypertension: treated with dilitiazem 300 mg daily, hydrochlorothiazide 12.5 mg twice daily, losartan 100 mg daily. Blood pressure elevated today at 160/70. She will contact us with blood pressure measurements.   Type 2 diabetes mellitus: treated with insulin glargine 50 units daily, metformin  500 mg twice daily with a meal. HGBA1c at 7.6% on 01/05/23, no change from 06/29/22. Referral to endocrinologist, Dr. Silvana Newness.   Hyperlipidemia: treated with simvastatin 20 mg daily in the evening. HDL low at 44, TRIG elevated at 157, LDL elevated at 103. AST elevated at 64, ALT elevated at 64 on 01/05/23, up from 42 and 41 on 06/29/22.    Musculoskeletal pain: treated with meloxicam 15 mg  daily.  Status post abdominal hysterectomy 1998.   Mammogram last completed 04/30/22. No mammographic evidence of malignancy. Recommended repeat in 2025.   Colonoscopy last completed 01/31/18. Results showed one 3 mm polyp in ascending colon, removed, severe diverticulosis in sigmoid colon, narrowing of colon in association with diverticular opening. Examination otherwise normal. Pathology showed tubular adenoma. Scheduled for repeat screening on 02/02/23.  DEXA scan last completed 06/16/21. T-score of -0.8 at left femur neck.  Vaccine counseling: she will go to the pharmacy for flu, Covid-19 vaccines with time in-between.  Return in 6 months for health maintenance exam or as needed.    I,Alexander Ruley,acting as a Neurosurgeon for [name], MD.,have documented all relevant documentation on the behalf of [name], MD,as directed by  [name], MD while in the presence of [name], MD.   ***

## 2023-01-04 ENCOUNTER — Other Ambulatory Visit (HOSPITAL_COMMUNITY): Payer: Self-pay

## 2023-01-04 NOTE — Telephone Encounter (Signed)
Pharmacy Patient Advocate Encounter  Received notification from [facility] that Prior Authorization for Dexcom has been APPROVED from 10.11.24 to 10.10.25. Ran test claim, Copay is $206.29 for a 67month supply. This test claim was processed through [facility]- copay amounts may vary at other pharmacies due to pharmacy/plan contracts, or as the patient moves through the different stages of their insurance plan.   PA #/Case ID/Reference #: Key: [id number]

## 2023-01-05 ENCOUNTER — Other Ambulatory Visit: Payer: Medicare HMO

## 2023-01-05 DIAGNOSIS — E7800 Pure hypercholesterolemia, unspecified: Secondary | ICD-10-CM | POA: Diagnosis not present

## 2023-01-05 DIAGNOSIS — E119 Type 2 diabetes mellitus without complications: Secondary | ICD-10-CM

## 2023-01-05 DIAGNOSIS — I10 Essential (primary) hypertension: Secondary | ICD-10-CM

## 2023-01-05 DIAGNOSIS — Z794 Long term (current) use of insulin: Secondary | ICD-10-CM | POA: Diagnosis not present

## 2023-01-06 LAB — HEMOGLOBIN A1C
Hgb A1c MFr Bld: 7.6 %{Hb} — ABNORMAL HIGH (ref <5.7)
Mean Plasma Glucose: 171 mg/dL
eAG (mmol/L): 9.5 mmol/L

## 2023-01-06 LAB — HEPATIC FUNCTION PANEL
AG Ratio: 1.2 (calc) (ref 1.0–2.5)
ALT: 64 U/L — ABNORMAL HIGH (ref 6–29)
AST: 64 U/L — ABNORMAL HIGH (ref 10–35)
Albumin: 4.3 g/dL (ref 3.6–5.1)
Alkaline phosphatase (APISO): 58 U/L (ref 37–153)
Bilirubin, Direct: 0.1 mg/dL (ref 0.0–0.2)
Globulin: 3.5 g/dL (ref 1.9–3.7)
Indirect Bilirubin: 0.3 mg/dL (ref 0.2–1.2)
Total Bilirubin: 0.4 mg/dL (ref 0.2–1.2)
Total Protein: 7.8 g/dL (ref 6.1–8.1)

## 2023-01-06 LAB — LIPID PANEL
Cholesterol: 172 mg/dL (ref <200)
HDL: 44 mg/dL — ABNORMAL LOW (ref >=50)
LDL Cholesterol (Calc): 103 mg/dL — ABNORMAL HIGH
Non-HDL Cholesterol (Calc): 128 mg/dL (ref <130)
Total CHOL/HDL Ratio: 3.9 (calc) (ref <5.0)
Triglycerides: 157 mg/dL — ABNORMAL HIGH (ref <150)

## 2023-01-08 ENCOUNTER — Ambulatory Visit (INDEPENDENT_AMBULATORY_CARE_PROVIDER_SITE_OTHER): Payer: Medicare HMO | Admitting: Internal Medicine

## 2023-01-08 ENCOUNTER — Encounter: Payer: Self-pay | Admitting: Internal Medicine

## 2023-01-08 VITALS — BP 160/100 | HR 78 | Ht 63.5 in | Wt 173.0 lb

## 2023-01-08 DIAGNOSIS — E1169 Type 2 diabetes mellitus with other specified complication: Secondary | ICD-10-CM

## 2023-01-08 DIAGNOSIS — I10 Essential (primary) hypertension: Secondary | ICD-10-CM

## 2023-01-08 DIAGNOSIS — M7918 Myalgia, other site: Secondary | ICD-10-CM

## 2023-01-08 DIAGNOSIS — Z794 Long term (current) use of insulin: Secondary | ICD-10-CM

## 2023-01-08 DIAGNOSIS — E119 Type 2 diabetes mellitus without complications: Secondary | ICD-10-CM | POA: Diagnosis not present

## 2023-01-08 DIAGNOSIS — E785 Hyperlipidemia, unspecified: Secondary | ICD-10-CM | POA: Diagnosis not present

## 2023-01-11 ENCOUNTER — Telehealth: Payer: Self-pay | Admitting: Internal Medicine

## 2023-01-11 ENCOUNTER — Other Ambulatory Visit: Payer: Self-pay

## 2023-01-11 ENCOUNTER — Other Ambulatory Visit (HOSPITAL_COMMUNITY): Payer: Self-pay

## 2023-01-11 ENCOUNTER — Telehealth: Payer: Self-pay

## 2023-01-11 MED ORDER — OLMESARTAN MEDOXOMIL 20 MG PO TABS
20.0000 mg | ORAL_TABLET | Freq: Every day | ORAL | 0 refills | Status: DC
  Filled 2023-01-11 (×2): qty 30, 30d supply, fill #0

## 2023-01-11 MED ORDER — OLMESARTAN MEDOXOMIL 20 MG PO TABS
20.0000 mg | ORAL_TABLET | Freq: Every day | ORAL | 0 refills | Status: DC
  Filled 2023-01-11: qty 30, 30d supply, fill #0

## 2023-01-11 NOTE — Addendum Note (Signed)
Addended by: Gregery Na on: 01/11/2023 01:46 PM   Modules accepted: Orders

## 2023-01-11 NOTE — Telephone Encounter (Signed)
Patient called in with blood pressure readings.   10/27 16/95 148/92  178/97   10/28 158/107 7:30am 176/107 12:23pm

## 2023-01-11 NOTE — Telephone Encounter (Signed)
Lisa Robertson [phone]  Hilda Lias called she has a couple questions about the changes in her blood pressure medicine.

## 2023-01-13 ENCOUNTER — Encounter: Payer: Self-pay | Admitting: Internal Medicine

## 2023-01-13 NOTE — Patient Instructions (Addendum)
Patient has some questions about Dexcom meter. Would benefit from Endocrine evaluation and referral will be made.  Hemoglobin A1c has not changed since April 2024 but is stable at 7.6%.  Patient would like to see Dr. Roanna Raider and referral will be made.  Continue to take Meloxicam 15 mg as needed for musculoskeletal pain.  Blood pressure is elevated today.  She will contact us with blood pressure measurements.  Currently on 3 drug regimen.  Plan will follow-up here in 6 months for health maintenance exam or as needed.  Scheduled for repeat colonoscopy November 2024.  Mammogram is up-to-date.  It was a pleasure to see you today.

## 2023-01-15 ENCOUNTER — Ambulatory Visit (AMBULATORY_SURGERY_CENTER): Payer: Medicare HMO

## 2023-01-15 VITALS — Ht 63.5 in | Wt 167.0 lb

## 2023-01-15 DIAGNOSIS — Z860101 Personal history of adenomatous and serrated colon polyps: Secondary | ICD-10-CM

## 2023-01-15 NOTE — Progress Notes (Signed)
No egg or soy allergy known to patient  No issues known to pt with past sedation with any surgeries or procedures Patient denies ever being told they had issues or difficulty with intubation  No FH of Malignant Hyperthermia Pt is not on diet pills Pt is not on  home 02  Pt is not on blood thinners  Pt denies issues with constipation  No A fib or A flutter Have any cardiac testing pending--no Pt can ambulate inedependently Pt denies use of chewing tobacco Discussed diabetic I weight loss medication holds Discussed NSAID holds Checked BMI Pt instructed to use Singlecare.com or GoodRx for a price reduction on prep  Patient's chart reviewed by Cathlyn Parsons CNRA prior to previsit and patient appropriate for the [facility].  Pre visit completed and red dot placed by patient's name on their procedure day (on provider's schedule).

## 2023-01-19 NOTE — Progress Notes (Addendum)
Patient Care Team: [name], MD as PCP - General (Internal Medicine)  Visit Date: 01/26/23  Subjective:    Patient ID: Lisa Robertson , Female   DOB: January 27, 1956, 67 y.o.    MRN: [id number]   67 y.o. Female presents today for a blood pressure recheck. History of hypertension treated with dilitiazem 300 mg daily, hydrochlorothiazide 12.5 mg twice daily, olmesartan 20 mg daily. Blood pressure elevated in-office today at 140/90. Blood pressures measured at home are as follows: 132 - 178 systolic, 76 - 107 diastolic. Denies feet swelling.  Past Medical History:  Diagnosis Date   Allergy    Arthritis    Colon polyp 10/16/2005   inflamed, Dr. Charlott Rakes   DM (diabetes mellitus) [facility])    Hx of adenomatous polyp of colon 02/06/2018   Hyperlipemia    Hypertension      Family History  Problem Relation Age of Onset   Diabetes Mother    Diabetes Father    Heart disease Father    Prostate cancer Father    Diabetes Sister        x2   Diabetes Maternal Grandmother    Diabetes Paternal Grandmother    Colon cancer Neg Hx    Breast cancer Neg Hx    Esophageal cancer Neg Hx    Rectal cancer Neg Hx    Stomach cancer Neg Hx    Colon polyps Neg Hx     Social Hx: reviewed and unchanged     Review of Systems  Constitutional:  Negative for fever and malaise/fatigue.  HENT:  Negative for congestion.   Eyes:  Negative for blurred vision.  Respiratory:  Negative for cough and shortness of breath.   Cardiovascular:  Negative for chest pain, palpitations and leg swelling.  Gastrointestinal:  Negative for vomiting.  Musculoskeletal:  Negative for back pain.  Skin:  Negative for rash.  Neurological:  Negative for loss of consciousness and headaches.        Objective:   Vitals: BP (!) 140/90   Pulse 66   Ht 5' 3.5" (1.613 m)   Wt 175 lb (79.4 kg)   SpO2 97%   BMI 30.51 kg/m    Physical Exam Vitals and nursing note reviewed.  Constitutional:      General:  She is not in acute distress.    Appearance: Normal appearance. She is not toxic-appearing.  HENT:     Head: Normocephalic and atraumatic.  Pulmonary:     Effort: Pulmonary effort is normal.  Skin:    General: Skin is warm and dry.  Neurological:     Mental Status: She is alert and oriented to person, place, and time. Mental status is at baseline.  Psychiatric:        Mood and Affect: Mood normal.        Behavior: Behavior normal.        Thought Content: Thought content normal.        Judgment: Judgment normal.       Results:   Studies obtained and personally reviewed by me:   Labs:       Component Value Date/Time   NA 140 06/29/2022 0919   K 4.4 06/29/2022 0919   CL 102 06/29/2022 0919   CO2 26 06/29/2022 0919   GLUCOSE 159 (H) 06/29/2022 0919   BUN 19 06/29/2022 0919   CREATININE 0.81 06/29/2022 0919   CALCIUM 9.8 06/29/2022 0919   PROT 7.8 01/05/2023 0933   ALBUMIN 4.0  05/29/2016 0900   AST 64 (H) 01/05/2023 0933   ALT 64 (H) 01/05/2023 0933   ALKPHOS 52 05/29/2016 0900   BILITOT 0.4 01/05/2023 0933   GFRNONAA 86 06/14/2020 0930   GFRAA 99 06/14/2020 0930     Lab Results  Component Value Date   WBC 6.6 06/29/2022   HGB 13.4 06/29/2022   HCT 40.7 06/29/2022   MCV 87.5 06/29/2022   PLT 309 06/29/2022    Lab Results  Component Value Date   CHOL 172 01/05/2023   HDL 44 (L) 01/05/2023   LDLCALC 103 (H) 01/05/2023   TRIG 157 (H) 01/05/2023   CHOLHDL 3.9 01/05/2023    Lab Results  Component Value Date   HGBA1C 7.6 (H) 01/05/2023     Lab Results  Component Value Date   TSH 1.44 06/29/2022      Assessment & Plan:   Hypertension:  Increase olmesartan from 20 to 40 mg daily. Continue with dilitiazem 300 mg daily, hydrochlorothiazide 12.5 mg twice daily. Blood pressure elevated in-office today at 140/90.  Return in two weeks for blood pressure recheck.Give more consideration to her antihypertensive therapy at next visit. See comments.   Going  forward, I think we should give consideration to changing her from Diltiazem to Amlodipine.  Also, she may need a stronger diuretic like furosemide or at least consider increasing hydrochlorothiazide to 25 mg daily  I,Alexander Ruley,acting as a scribe for [name], MD.,have documented all relevant documentation on the behalf of [name], MD,as directed by  [name], MD while in the presence of [name], MD.  I, [name], MD, have reviewed all documentation for this visit. The documentation on 01/26/23 for the exam, diagnosis, procedures, and orders are all accurate and complete.

## 2023-01-26 ENCOUNTER — Other Ambulatory Visit: Payer: Self-pay

## 2023-01-26 ENCOUNTER — Encounter: Payer: Self-pay | Admitting: Internal Medicine

## 2023-01-26 ENCOUNTER — Ambulatory Visit (INDEPENDENT_AMBULATORY_CARE_PROVIDER_SITE_OTHER): Payer: Medicare HMO | Admitting: Internal Medicine

## 2023-01-26 ENCOUNTER — Other Ambulatory Visit (HOSPITAL_COMMUNITY): Payer: Self-pay

## 2023-01-26 VITALS — BP 142/90 | HR 66 | Ht 63.5 in | Wt 175.0 lb

## 2023-01-26 DIAGNOSIS — I10 Essential (primary) hypertension: Secondary | ICD-10-CM | POA: Diagnosis not present

## 2023-01-26 DIAGNOSIS — Z23 Encounter for immunization: Secondary | ICD-10-CM

## 2023-01-26 MED ORDER — OLMESARTAN MEDOXOMIL 40 MG PO TABS
40.0000 mg | ORAL_TABLET | Freq: Every day | ORAL | 0 refills | Status: DC
  Filled 2023-01-26 (×2): qty 90, 90d supply, fill #0

## 2023-01-26 NOTE — Patient Instructions (Addendum)
Increase  Benicar to 40 mg daily and return in late November 26 for follow up and if BP still elevated, we have some options with diuretic choice and dose as well as adding amlodipine.Flu vaccine given.

## 2023-01-27 ENCOUNTER — Other Ambulatory Visit (HOSPITAL_COMMUNITY): Payer: Self-pay

## 2023-01-29 DIAGNOSIS — E559 Vitamin D deficiency, unspecified: Secondary | ICD-10-CM | POA: Diagnosis not present

## 2023-01-29 DIAGNOSIS — E1165 Type 2 diabetes mellitus with hyperglycemia: Secondary | ICD-10-CM | POA: Diagnosis not present

## 2023-01-29 DIAGNOSIS — E7800 Pure hypercholesterolemia, unspecified: Secondary | ICD-10-CM | POA: Diagnosis not present

## 2023-01-29 DIAGNOSIS — I10 Essential (primary) hypertension: Secondary | ICD-10-CM | POA: Diagnosis not present

## 2023-02-01 ENCOUNTER — Other Ambulatory Visit: Payer: Self-pay

## 2023-02-01 ENCOUNTER — Other Ambulatory Visit (HOSPITAL_COMMUNITY): Payer: Self-pay

## 2023-02-01 MED ORDER — CARVEDILOL 12.5 MG PO TABS
12.5000 mg | ORAL_TABLET | Freq: Two times a day (BID) | ORAL | 5 refills | Status: DC
  Filled 2023-02-01 (×2): qty 60, 30d supply, fill #0
  Filled 2023-02-24: qty 180, 90d supply, fill #1

## 2023-02-01 NOTE — Progress Notes (Unsigned)
[facility] History and Physical   Primary Care Physician:  [name], MD   Reason for Procedure:  History of adenomatous colon polyp  Plan:    Colonoscopy     HPI: Lisa Robertson is a 67 y.o. female presenting for a surveillance colonoscopy, status post removal of a 3 mm adenoma in November 2019.   Past Medical History:  Diagnosis Date   Allergy    Arthritis    Colon polyp 10/16/2005   inflamed, Dr. Charlott Rakes   DM (diabetes mellitus) [facility])    Hx of adenomatous polyp of colon 02/06/2018   Hyperlipemia    Hypertension     Past Surgical History:  Procedure Laterality Date   ABDOMINAL HYSTERECTOMY  10/1996   COLONOSCOPY W/ BIOPSIES  10/16/2005   Dr. Charlott Rakes   REPLACEMENT TOTAL KNEE Left    TUBAL LIGATION  1983    Prior to Admission medications   Medication Sig Start Date End Date Taking? Authorizing Provider  Accu-Chek Softclix Lancets lancets Use as instructed. 05/29/22   [name], MD  amoxicillin (AMOXIL) 500 MG capsule Take 4 capsules (2,000 mg total) by mouth 60 (sixty) minutes before procedure for 1 dose. 12/24/22     aspirin 81 MG tablet Take 81 mg by mouth daily.    [provider]  atenolol (TENORMIN) 50 MG tablet Take 1 tablet (50 mg total) by mouth daily. 05/11/22   [name], MD  Blood Glucose Monitoring Suppl (ACCU-CHEK GUIDE) w/Device KIT Use as directed to test blood sugar four times daily 03/18/21   [name], MD  carvedilol (COREG) 12.5 MG tablet Take 1 tablet (12.5 mg total) by mouth 2 (two) times daily with food to lower blood pressure. *STOP the atenolol* 01/31/23     Cholecalciferol (VITAMIN D-3) 1000 UNITS CAPS Take 1 capsule by mouth daily.    [provider]  Continuous Glucose Receiver (DEXCOM G7 RECEIVER) DEVI Use as directed 07/10/22   [name], MD  Continuous Glucose Sensor (DEXCOM G7 SENSOR) MISC Change sensor every 10 days as directed. 05/29/22   [name], MD   diltiazem (CARTIA XT) 300 MG 24 hr capsule Take 1 capsule (300 mg total) by mouth daily. 05/11/22   [name], MD  glucose blood (ACCU-CHEK GUIDE) test strip Use to test blood sugar up to four times daily. 05/26/22   [name], MD  hydrochlorothiazide (MICROZIDE) 12.5 MG capsule Take 1 capsule (12.5 mg total) by mouth 2 (two) times daily. 05/11/22   [name], MD  insulin aspart (NOVOLOG FLEXPEN) 100 UNIT/ML FlexPen Inject 12 Units into the skin 3 (three) times daily with meals. 05/26/22   [name], MD  insulin glargine (LANTUS) 100 UNIT/ML Solostar Pen Inject 50 Units into the skin daily. 05/29/22   [name], MD  Insulin Pen Needle (BD PEN NEEDLE NANO U/F) 32G X 4 MM MISC USE AS DIRECTED WITH LEVEMIR 06/20/19   [name], MD  Insulin Pen Needle (TECHLITE PEN NEEDLES) 32G X 4 MM MISC USE AS DIRECTED 4 TIMES DAILY 05/11/22 05/11/23  [name], MD  ketoconazole (NIZORAL) 2 % cream Apply 1 application topically daily. 10/05/22   [name], MD  Lancets (ACCU-CHEK SAFE-T PRO) lancets Check glucose 4 times daily 07/08/22   [name], MD  meloxicam (MOBIC) 15 MG tablet Take 1 tablet (15 mg total) by mouth daily. 08/27/22   [name], MD  metFORMIN (GLUCOPHAGE) 500 MG tablet Take 1 tablet (500 mg total) by mouth 2 (two) times daily with a meal. 05/11/22   Baxley, Luanna Cole, MD  olmesartan (BENICAR) 40 MG tablet Take 1 tablet (40 mg total) by mouth daily. 01/26/23   [name], MD  simvastatin (ZOCOR) 20 MG tablet Take 1 tablet (20 mg total) by mouth every evening. 05/11/22   [name], MD    Current Outpatient Medications  Medication Sig Dispense Refill   Accu-Chek Softclix Lancets lancets Use as instructed. 200 each 1   aspirin 81 MG tablet Take 81 mg by mouth daily.     atenolol (TENORMIN) 50 MG tablet Take 1 tablet (50 mg total) by mouth daily. 90 tablet 3   Blood Glucose Monitoring Suppl (ACCU-CHEK GUIDE) w/Device KIT Use as directed to test blood sugar  four times daily 1 kit 0   Cholecalciferol (VITAMIN D-3) 1000 UNITS CAPS Take 1 capsule by mouth daily.     Continuous Glucose Receiver (DEXCOM G7 RECEIVER) DEVI Use as directed 1 each 0   Continuous Glucose Sensor (DEXCOM G7 SENSOR) MISC Change sensor every 10 days as directed. 9 each 3   diltiazem (CARTIA XT) 300 MG 24 hr capsule Take 1 capsule (300 mg total) by mouth daily. 90 capsule 3   glucose blood (ACCU-CHEK GUIDE) test strip Use to test blood sugar up to four times daily. 400 each 3   hydrochlorothiazide (MICROZIDE) 12.5 MG capsule Take 1 capsule (12.5 mg total) by mouth 2 (two) times daily. 180 capsule 3   insulin aspart (NOVOLOG FLEXPEN) 100 UNIT/ML FlexPen Inject 12 Units into the skin 3 (three) times daily with meals. 33 mL 3   insulin glargine (LANTUS) 100 UNIT/ML Solostar Pen Inject 50 Units into the skin daily. 45 mL 2   Insulin Pen Needle (BD PEN NEEDLE NANO U/F) 32G X 4 MM MISC USE AS DIRECTED WITH LEVEMIR 300 each 3   Insulin Pen Needle (TECHLITE PEN NEEDLES) 32G X 4 MM MISC USE AS DIRECTED 4 TIMES DAILY 400 each 3   ketoconazole (NIZORAL) 2 % cream Apply 1 application topically daily. 15 g PRN   Lancets (ACCU-CHEK SAFE-T PRO) lancets Check glucose 4 times daily 400 each 3   meloxicam (MOBIC) 15 MG tablet Take 1 tablet (15 mg total) by mouth daily. 90 tablet 1   metFORMIN (GLUCOPHAGE) 500 MG tablet Take 1 tablet (500 mg total) by mouth 2 (two) times daily with a meal. 180 tablet 3   olmesartan (BENICAR) 40 MG tablet Take 1 tablet (40 mg total) by mouth daily. 90 tablet 0   simvastatin (ZOCOR) 20 MG tablet Take 1 tablet (20 mg total) by mouth every evening. 90 tablet 3   amoxicillin (AMOXIL) 500 MG capsule Take 4 capsules (2,000 mg total) by mouth 60 (sixty) minutes before procedure for 1 dose. 8 capsule 0   carvedilol (COREG) 12.5 MG tablet Take 1 tablet (12.5 mg total) by mouth 2 (two) times daily with food to lower blood pressure. *STOP the atenolol* 60 tablet 5   No current  facility-administered medications for this visit.    Allergies as of 02/02/2023   (No Known Allergies)    Family History  Problem Relation Age of Onset   Diabetes Mother    Diabetes Father    Heart disease Father    Prostate cancer Father    Diabetes Sister        x2   Diabetes Maternal Grandmother  Diabetes Paternal Grandmother    Colon cancer Neg Hx    Breast cancer Neg Hx    Esophageal cancer Neg Hx    Rectal cancer Neg Hx    Stomach cancer Neg Hx    Colon polyps Neg Hx     Social History   Socioeconomic History   Marital status: Married    Spouse name: Not on file   Number of children: 3   Years of education: Not on file   Highest education level: Not on file  Occupational History   Occupation: retired  Tobacco Use   Smoking status: Never   Smokeless tobacco: Never  Vaping Use   Vaping status: Never Used  Substance and Sexual Activity   Alcohol use: No   Drug use: No   Sexual activity: Not on file  Other Topics Concern   Not on file  Social History Narrative   Not on file   Social Determinants of Health   Financial Resource Strain: Low Risk  (01/26/2023)   Overall Financial Resource Strain (CARDIA)    Difficulty of Paying Living Expenses: Not hard at all  Food Insecurity: No Food Insecurity (01/26/2023)   Hunger Vital Sign    Worried About Running Out of Food in the Last Year: Never true    Ran Out of Food in the Last Year: Never true  Transportation Needs: No Transportation Needs (01/26/2023)   PRAPARE - Administrator, Civil Service (Medical): No    Lack of Transportation (Non-Medical): No  Physical Activity: Not on file  Stress: Not on file  Social Connections: Not on file  Intimate Partner Violence: Not on file    Review of Systems:  All other review of systems negative except as mentioned in the HPI.  Physical Exam: Vital signs BP (!) 136/99   Pulse 80   Temp (!) 97.3 F (36.3 C) (Temporal)   Ht 5' 3.5" (1.613 m)    Wt 167 lb (75.8 kg)   SpO2 100%   BMI 29.12 kg/m   General:   Alert,  Well-developed, well-nourished, pleasant and cooperative in NAD Lungs:  Clear throughout to auscultation.   Heart:  Regular rate and rhythm; no murmurs, clicks, rubs,  or gallops. Abdomen:  Soft, nontender and nondistended. Normal bowel sounds.   Neuro/Psych:  Alert and cooperative. Normal mood and affect. A and O x 3   @Marce Schartz  Sena Slate, MD, [facility] [phone] (pager) 02/02/2023 8:06 AM@

## 2023-02-02 ENCOUNTER — Encounter: Payer: Self-pay | Admitting: Internal Medicine

## 2023-02-02 ENCOUNTER — Ambulatory Visit: Payer: Medicare HMO | Admitting: Internal Medicine

## 2023-02-02 ENCOUNTER — Other Ambulatory Visit (HOSPITAL_COMMUNITY): Payer: Self-pay

## 2023-02-02 ENCOUNTER — Telehealth: Payer: Self-pay | Admitting: Internal Medicine

## 2023-02-02 VITALS — BP 159/80 | HR 92 | Temp 97.3°F | Resp 16 | Ht 63.5 in | Wt 167.0 lb

## 2023-02-02 DIAGNOSIS — Z1211 Encounter for screening for malignant neoplasm of colon: Secondary | ICD-10-CM | POA: Diagnosis not present

## 2023-02-02 DIAGNOSIS — K635 Polyp of colon: Secondary | ICD-10-CM | POA: Diagnosis not present

## 2023-02-02 DIAGNOSIS — Z09 Encounter for follow-up examination after completed treatment for conditions other than malignant neoplasm: Secondary | ICD-10-CM

## 2023-02-02 DIAGNOSIS — D122 Benign neoplasm of ascending colon: Secondary | ICD-10-CM

## 2023-02-02 DIAGNOSIS — D125 Benign neoplasm of sigmoid colon: Secondary | ICD-10-CM

## 2023-02-02 DIAGNOSIS — Z860101 Personal history of adenomatous and serrated colon polyps: Secondary | ICD-10-CM

## 2023-02-02 DIAGNOSIS — I10 Essential (primary) hypertension: Secondary | ICD-10-CM | POA: Diagnosis not present

## 2023-02-02 DIAGNOSIS — E119 Type 2 diabetes mellitus without complications: Secondary | ICD-10-CM | POA: Diagnosis not present

## 2023-02-02 DIAGNOSIS — E669 Obesity, unspecified: Secondary | ICD-10-CM | POA: Diagnosis not present

## 2023-02-02 MED ORDER — SODIUM CHLORIDE 0.9 % IV SOLN: 500.0000 mL | Freq: Once | INTRAVENOUS | Status: DC

## 2023-02-02 NOTE — Progress Notes (Signed)
Pt's states no medical or surgical changes since previsit or office visit. 

## 2023-02-02 NOTE — Patient Instructions (Addendum)
Three tiny polyps removed today. I will let you know pathology results and when to have another routine colonoscopy by mail and/or My Chart.  You also have a condition called diverticulosis - common and not usually a problem. Please read the handout provided.  An external hemorrhoid also seen - it got irritated and is bleeding some. Don't be alarmed that will stop.  I appreciate the opportunity to care for you. Iva Boop, MD, [facility]  Resume  all of your previous medications today as ordered.  Read all your previous medications today.   YOU HAD AN ENDOSCOPIC PROCEDURE TODAY AT THE [facility]:   Refer to the procedure report that was given to you for any specific questions about what was found during the examination.  If the procedure report does not answer your questions, please call your gastroenterologist to clarify.  If you requested that your care partner not be given the details of your procedure findings, then the procedure report has been included in a sealed envelope for you to review at your convenience later.  YOU SHOULD EXPECT: Some feelings of bloating in the abdomen. Passage of more gas than usual.  Walking can help get rid of the air that was put into your GI tract during the procedure and reduce the bloating. If you had a lower endoscopy (such as a colonoscopy or flexible sigmoidoscopy) you may notice spotting of blood in your stool or on the toilet paper. If you underwent a bowel prep for your procedure, you may not have a normal bowel movement for a few days.  Please Note:  You might notice some irritation and congestion in your nose or some drainage.  This is from the oxygen used during your procedure.  There is no need for concern and it should clear up in a day or so.  SYMPTOMS TO REPORT IMMEDIATELY:  Following lower endoscopy (colonoscopy or flexible sigmoidoscopy):  Excessive amounts of blood in the stool  Significant tenderness or worsening of abdominal  pains  Swelling of the abdomen that is new, acute  Fever of 100F or higher   For urgent or emergent issues, a gastroenterologist can be reached at any hour by calling [phone]. Do not use MyChart messaging for urgent concerns.    DIET:  We do recommend a small meal at first, but then you may proceed to your regular diet.  Drink plenty of fluids but you should avoid alcoholic beverages for 24 hours. Try ton increase the fiber in your  diet, and drink plenty of water.  ACTIVITY:  You should plan to take it easy for the rest of today and you should NOT DRIVE or use heavy machinery until tomorrow (because of the sedation medicines used during the test).    FOLLOW UP: Our staff will call the number listed on your records the next business day following your procedure.  We will call around 7:15- 8:00 am to check on you and address any questions or concerns that you may have regarding the information given to you following your procedure. If we do not reach you, we will leave a message.     If any biopsies were taken you will be contacted by phone or by letter within the next 1-3 weeks.  Please call us at [phone] if you have not heard about the biopsies in 3 weeks.    SIGNATURES/CONFIDENTIALITY: You and/or your care partner have signed paperwork which will be entered into your electronic medical record.  These signatures attest to the fact that that the information above on your After Visit Summary has been reviewed and is understood.  Full responsibility of the confidentiality of this discharge information lies with you and/or your care-partner.

## 2023-02-02 NOTE — Telephone Encounter (Signed)
Lisa Robertson was called and he advised that they had figured out the call was an automated message about pt appt next week. No further action needed on this matter.

## 2023-02-02 NOTE — Op Note (Signed)
[facility] Patient Name: Lisa Robertson Procedure Date: 02/02/2023 7:14 AM MRN: [id number] Endoscopist: Iva Boop , MD, [id number] Age: 67 Referring MD:  Date of Birth: [birth year] Gender: Female Account #: [id number] Procedure:                Colonoscopy Indications:              Surveillance: Personal history of adenomatous                            polyps on last colonoscopy 5 years ago, Last                            colonoscopy: November 2019 Medicines:                Monitored Anesthesia Care Procedure:                Pre-Anesthesia Assessment:                           - Prior to the procedure, a History and Physical                            was performed, and patient medications and                            allergies were reviewed. The patient's tolerance of                            previous anesthesia was also reviewed. The risks                            and benefits of the procedure and the sedation                            options and risks were discussed with the patient.                            All questions were answered, and informed consent                            was obtained. Prior Anticoagulants: The patient has                            taken no anticoagulant or antiplatelet agents. ASA                            Grade Assessment: II - A patient with mild systemic                            disease. After reviewing the risks and benefits,                            the patient was deemed in satisfactory condition to  undergo the procedure.                           After obtaining informed consent, the colonoscope                            was passed under direct vision. Throughout the                            procedure, the patient's blood pressure, pulse, and                            oxygen saturations were monitored continuously. The                            CF [id number] #[id number] was introduced  through the anus                            and advanced to the the cecum, identified by                            appendiceal orifice and ileocecal valve. The                            colonoscopy was performed without difficulty. The                            patient tolerated the procedure well. The quality                            of the bowel preparation was good. The ileocecal                            valve, appendiceal orifice, and rectum were                            photographed. The bowel preparation used was                            Miralax via split dose instruction. Scope In: 8:11:33 AM Scope Out: 8:29:05 AM Scope Withdrawal Time: 0 hours 14 minutes 42 seconds  Total Procedure Duration: 0 hours 17 minutes 32 seconds  Findings:                 Hemorrhoids were found on perianal exam.                           Three sessile polyps were found in the sigmoid                            colon and ascending colon. The polyps were                            diminutive in size. These polyps were removed with  a cold snare. Resection and retrieval were                            complete. Verification of patient identification                            for the specimen was done. Estimated blood loss was                            minimal.                           Multiple diverticula were found in the left colon.                           The exam was otherwise without abnormality on                            direct and retroflexion views. Complications:            No immediate complications. Estimated Blood Loss:     Estimated blood loss was minimal. Impression:               - Hemorrhoids found on perianal exam.                           - Three diminutive polyps in the sigmoid colon and                            in the ascending colon, removed with a cold snare.                            Resected and retrieved.                           -  Diverticulosis in the left colon.                           - The examination was otherwise normal on direct                            and retroflexion views.                           - Personal history of colonic polyp diminutive                            adenoma 01/2018 Recommendation:           - Patient has a contact number available for                            emergencies. The signs and symptoms of potential                            delayed complications were discussed with the  patient. Return to normal activities tomorrow.                            Written discharge instructions were provided to the                            patient.                           - Resume previous diet.                           - Continue present medications.                           - Repeat colonoscopy is recommended. The                            colonoscopy date will be determined after pathology                            results from today's exam become available for                            review. Iva Boop, MD 02/02/2023 8:39:53 AM This report has been signed electronically.

## 2023-02-02 NOTE — Progress Notes (Signed)
Called to room to assist during endoscopic procedure.  Patient ID and intended procedure confirmed with present staff. Received instructions for my participation in the procedure from the performing physician.  

## 2023-02-02 NOTE — Progress Notes (Signed)
Report to PACU, RN, vss, BBS= Clear.  

## 2023-02-02 NOTE — Telephone Encounter (Signed)
Pt husband was returning a phone call from the office there was no notes on the account

## 2023-02-02 NOTE — Progress Notes (Addendum)
Patient Care Team: Lisa Mackintosh, MD as PCP - General (Internal Medicine)  Visit Date: 02/09/23  Subjective:    Patient ID: Lisa Robertson , Female   DOB: November 04, 1955, 67 y.o.    MRN: [id number]   67 y.o. Female presents today for blood pressure recheck. History of hypertension treated with olmesartan 40 mg daily, dilitiazem 300 mg daily, hydrochlorothiazide 12.5 mg twice daily. Blood pressure normal in-office today at 110/80. Seen by Dr. Ocie Robertson on 01/31/23 and started on carvedilol 12.5 mg twice daily. Atenolol discontinued.  02/02/23 colonoscopy showed hemorrhoids on perianal exam, three diminutive polyps in sigmoid, ascending colon, diverticulosis in left colon. Pathology findings were as follows: 1 fragment of colonic mucosa with lymphoid aggregates, 1 polypoid fragment of colonic mucosa with cryptitis/mild activity, 1 hyperplastic polyp. Repeat recommended 2029.  Past Medical History:  Diagnosis Date   Allergy    Arthritis    Colon polyp 10/16/2005   inflamed, Dr. Charlott Rakes   DM (diabetes mellitus) [facility])    Hx of adenomatous polyp of colon 02/06/2018   Hyperlipemia    Hypertension      Family History  Problem Relation Age of Onset   Diabetes Mother    Diabetes Father    Heart disease Father    Prostate cancer Father    Diabetes Sister        x2   Diabetes Maternal Grandmother    Diabetes Paternal Grandmother    Colon cancer Neg Hx    Breast cancer Neg Hx    Esophageal cancer Neg Hx    Rectal cancer Neg Hx    Stomach cancer Neg Hx    Colon polyps Neg Hx          Review of Systems  Constitutional:  Negative for fever and malaise/fatigue.  HENT:  Negative for congestion.   Eyes:  Negative for blurred vision.  Respiratory:  Negative for cough and shortness of breath.   Cardiovascular:  Negative for chest pain, palpitations and leg swelling.  Gastrointestinal:  Negative for vomiting.  Musculoskeletal:  Negative for back pain.  Skin:   Negative for rash.  Neurological:  Negative for loss of consciousness and headaches.        Objective:   Vitals: BP 110/80   Pulse 69   Ht 5' 3.5" (1.613 m)   Wt 175 lb (79.4 kg)   SpO2 98%   BMI 30.51 kg/m    Physical Exam Vitals and nursing note reviewed.  Constitutional:      General: She is not in acute distress.    Appearance: Normal appearance. She is not toxic-appearing.  HENT:     Head: Normocephalic and atraumatic.  Pulmonary:     Effort: Pulmonary effort is normal.  Skin:    General: Skin is warm and dry.  Neurological:     Mental Status: She is alert and oriented to person, place, and time. Mental status is at baseline.  Psychiatric:        Mood and Affect: Mood normal.        Behavior: Behavior normal.        Thought Content: Thought content normal.        Judgment: Judgment normal.      Results:   Studies obtained and personally reviewed by me:  02/02/23 colonoscopy showed hemorrhoids on perianal exam, three diminutive polyps in sigmoid, ascending colon, diverticulosis in left colon. Pathology showed fragment of colonic mucosa with lymphoid aggregates, polypoid fragment of colonic mucosa  with cryptitis/mild activity, hyperplastic polyp.  Labs:       Component Value Date/Time   NA 140 06/29/2022 0919   K 4.4 06/29/2022 0919   CL 102 06/29/2022 0919   CO2 26 06/29/2022 0919   GLUCOSE 159 (H) 06/29/2022 0919   BUN 19 06/29/2022 0919   CREATININE 0.81 06/29/2022 0919   CALCIUM 9.8 06/29/2022 0919   PROT 7.8 01/05/2023 0933   ALBUMIN 4.0 05/29/2016 0900   AST 64 (H) 01/05/2023 0933   ALT 64 (H) 01/05/2023 0933   ALKPHOS 52 05/29/2016 0900   BILITOT 0.4 01/05/2023 0933   GFRNONAA 86 06/14/2020 0930   GFRAA 99 06/14/2020 0930     Lab Results  Component Value Date   WBC 6.6 06/29/2022   HGB 13.4 06/29/2022   HCT 40.7 06/29/2022   MCV 87.5 06/29/2022   PLT 309 06/29/2022    Lab Results  Component Value Date   CHOL 172 01/05/2023   HDL  44 (L) 01/05/2023   LDLCALC 103 (H) 01/05/2023   TRIG 157 (H) 01/05/2023   CHOLHDL 3.9 01/05/2023    Lab Results  Component Value Date   HGBA1C 7.6 (H) 01/05/2023     Lab Results  Component Value Date   TSH 1.44 06/29/2022      Assessment & Plan:   Hypertension: continue with olmesartan 40 mg daily, dilitiazem 300 mg daily, hydrochlorothiazide 12.5 mg twice daily, carvedilol 12.5 mg twice daily. BP is excellent today on current regimen.  02/02/23 colonoscopy showed hemorrhoids on perianal exam, three diminutive polyps in sigmoid, ascending colon, diverticulosis in left colon. Pathology showed fragment of colonic mucosa with lymphoid aggregates, polypoid fragment of colonic mucosa with cryptitis/mild activity, hyperplastic polyp.    I,Alexander Ruley,acting as a Neurosurgeon for Lisa Mackintosh, MD.,have documented all relevant documentation on the behalf of Lisa Mackintosh, MD,as directed by  Lisa Mackintosh, MD while in the presence of Lisa Mackintosh, MD.   I, Lisa Mackintosh, MD, have reviewed all documentation for this visit. The documentation on 02/13/23 for the exam, diagnosis, procedures, and orders are all accurate and complete.

## 2023-02-03 ENCOUNTER — Telehealth: Payer: Self-pay

## 2023-02-03 NOTE — Telephone Encounter (Signed)
Post procedure follow up call, no answer 

## 2023-02-04 LAB — SURGICAL PATHOLOGY

## 2023-02-07 LAB — HM DIABETES EYE EXAM

## 2023-02-08 DIAGNOSIS — H5213 Myopia, bilateral: Secondary | ICD-10-CM | POA: Diagnosis not present

## 2023-02-08 DIAGNOSIS — H524 Presbyopia: Secondary | ICD-10-CM | POA: Diagnosis not present

## 2023-02-09 ENCOUNTER — Ambulatory Visit (INDEPENDENT_AMBULATORY_CARE_PROVIDER_SITE_OTHER): Payer: Medicare HMO | Admitting: Internal Medicine

## 2023-02-09 ENCOUNTER — Encounter: Payer: Self-pay | Admitting: Internal Medicine

## 2023-02-09 VITALS — BP 110/80 | HR 69 | Ht 63.5 in | Wt 175.0 lb

## 2023-02-09 DIAGNOSIS — Z794 Long term (current) use of insulin: Secondary | ICD-10-CM | POA: Diagnosis not present

## 2023-02-09 DIAGNOSIS — Z860101 Personal history of adenomatous and serrated colon polyps: Secondary | ICD-10-CM

## 2023-02-09 DIAGNOSIS — I10 Essential (primary) hypertension: Secondary | ICD-10-CM | POA: Diagnosis not present

## 2023-02-09 DIAGNOSIS — E119 Type 2 diabetes mellitus without complications: Secondary | ICD-10-CM

## 2023-02-10 ENCOUNTER — Encounter: Payer: Medicare HMO | Admitting: Internal Medicine

## 2023-02-13 ENCOUNTER — Encounter: Payer: Self-pay | Admitting: Internal Medicine

## 2023-02-13 NOTE — Patient Instructions (Addendum)
Blood pressure is excellent today on current regimen.  May follow-up in 6 months or as needed.

## 2023-02-24 ENCOUNTER — Other Ambulatory Visit: Payer: Self-pay

## 2023-02-26 ENCOUNTER — Other Ambulatory Visit: Payer: Self-pay

## 2023-03-04 ENCOUNTER — Other Ambulatory Visit: Payer: Self-pay | Admitting: Internal Medicine

## 2023-03-04 ENCOUNTER — Other Ambulatory Visit (HOSPITAL_COMMUNITY): Payer: Self-pay

## 2023-03-04 ENCOUNTER — Other Ambulatory Visit: Payer: Self-pay

## 2023-03-04 MED ORDER — MELOXICAM 15 MG PO TABS
15.0000 mg | ORAL_TABLET | Freq: Every day | ORAL | 1 refills | Status: DC
  Filled 2023-03-04: qty 90, 90d supply, fill #0
  Filled 2023-05-26: qty 90, 90d supply, fill #1

## 2023-03-08 ENCOUNTER — Other Ambulatory Visit: Payer: Self-pay

## 2023-03-23 ENCOUNTER — Other Ambulatory Visit: Payer: Self-pay

## 2023-03-23 ENCOUNTER — Other Ambulatory Visit: Payer: Self-pay | Admitting: Internal Medicine

## 2023-03-23 MED ORDER — LANTUS SOLOSTAR 100 UNIT/ML ~~LOC~~ SOPN
50.0000 [IU] | PEN_INJECTOR | Freq: Every day | SUBCUTANEOUS | 2 refills | Status: DC
  Filled 2023-03-23: qty 45, 90d supply, fill #0
  Filled 2023-06-16: qty 45, 90d supply, fill #1
  Filled 2023-09-27 (×2): qty 45, 90d supply, fill #2

## 2023-04-01 DIAGNOSIS — E1165 Type 2 diabetes mellitus with hyperglycemia: Secondary | ICD-10-CM | POA: Diagnosis not present

## 2023-04-01 DIAGNOSIS — I10 Essential (primary) hypertension: Secondary | ICD-10-CM | POA: Diagnosis not present

## 2023-04-01 DIAGNOSIS — E11649 Type 2 diabetes mellitus with hypoglycemia without coma: Secondary | ICD-10-CM | POA: Diagnosis not present

## 2023-04-01 DIAGNOSIS — E7800 Pure hypercholesterolemia, unspecified: Secondary | ICD-10-CM | POA: Diagnosis not present

## 2023-04-01 DIAGNOSIS — E559 Vitamin D deficiency, unspecified: Secondary | ICD-10-CM | POA: Diagnosis not present

## 2023-04-12 ENCOUNTER — Other Ambulatory Visit: Payer: Self-pay

## 2023-04-12 ENCOUNTER — Other Ambulatory Visit (HOSPITAL_COMMUNITY): Payer: Self-pay

## 2023-04-12 ENCOUNTER — Other Ambulatory Visit: Payer: Self-pay | Admitting: Internal Medicine

## 2023-04-12 DIAGNOSIS — E119 Type 2 diabetes mellitus without complications: Secondary | ICD-10-CM

## 2023-04-12 MED ORDER — GLUCOSE BLOOD VI STRP
ORAL_STRIP | 3 refills | Status: AC
  Filled 2023-04-12: qty 350, 88d supply, fill #0
  Filled 2023-07-07: qty 350, 88d supply, fill #1
  Filled 2024-02-23: qty 350, 88d supply, fill #2

## 2023-04-13 ENCOUNTER — Other Ambulatory Visit: Payer: Self-pay

## 2023-04-13 ENCOUNTER — Other Ambulatory Visit (HOSPITAL_COMMUNITY): Payer: Self-pay

## 2023-04-28 ENCOUNTER — Other Ambulatory Visit (HOSPITAL_COMMUNITY): Payer: Self-pay

## 2023-04-28 ENCOUNTER — Other Ambulatory Visit: Payer: Self-pay | Admitting: Internal Medicine

## 2023-04-28 ENCOUNTER — Other Ambulatory Visit: Payer: Self-pay

## 2023-04-28 MED ORDER — DILTIAZEM HCL ER COATED BEADS 300 MG PO CP24
300.0000 mg | ORAL_CAPSULE | Freq: Every day | ORAL | 3 refills | Status: DC
  Filled 2023-04-28: qty 90, 90d supply, fill #0
  Filled 2023-07-27: qty 90, 90d supply, fill #1
  Filled 2023-10-21: qty 90, 90d supply, fill #2
  Filled 2024-01-20: qty 90, 90d supply, fill #3

## 2023-04-28 MED ORDER — HYDROCHLOROTHIAZIDE 12.5 MG PO CAPS
12.5000 mg | ORAL_CAPSULE | Freq: Two times a day (BID) | ORAL | 3 refills | Status: DC
  Filled 2023-04-28: qty 180, 90d supply, fill #0
  Filled 2023-07-27: qty 180, 90d supply, fill #1
  Filled 2023-10-21: qty 180, 90d supply, fill #2
  Filled 2024-01-20: qty 180, 90d supply, fill #3

## 2023-04-28 MED ORDER — METFORMIN HCL 500 MG PO TABS
500.0000 mg | ORAL_TABLET | Freq: Two times a day (BID) | ORAL | 3 refills | Status: DC
  Filled 2023-04-28: qty 180, 90d supply, fill #0
  Filled 2023-07-27: qty 180, 90d supply, fill #1
  Filled 2023-10-21: qty 180, 90d supply, fill #2
  Filled 2024-01-20: qty 180, 90d supply, fill #3

## 2023-04-28 MED ORDER — OLMESARTAN MEDOXOMIL 40 MG PO TABS
40.0000 mg | ORAL_TABLET | Freq: Every day | ORAL | 0 refills | Status: DC
  Filled 2023-04-28: qty 90, 90d supply, fill #0

## 2023-04-28 MED ORDER — SIMVASTATIN 20 MG PO TABS
20.0000 mg | ORAL_TABLET | Freq: Every evening | ORAL | 3 refills | Status: DC
  Filled 2023-04-28: qty 90, 90d supply, fill #0
  Filled 2023-07-27: qty 90, 90d supply, fill #1
  Filled 2023-10-21: qty 90, 90d supply, fill #2
  Filled 2024-01-20: qty 90, 90d supply, fill #3

## 2023-04-29 ENCOUNTER — Other Ambulatory Visit: Payer: Self-pay

## 2023-05-10 ENCOUNTER — Other Ambulatory Visit: Payer: Self-pay | Admitting: Internal Medicine

## 2023-05-10 DIAGNOSIS — Z1231 Encounter for screening mammogram for malignant neoplasm of breast: Secondary | ICD-10-CM

## 2023-05-18 ENCOUNTER — Ambulatory Visit
Admission: RE | Admit: 2023-05-18 | Discharge: 2023-05-18 | Disposition: A | Discharge status: Home / Self Care | Payer: Medicare HMO | Source: Ambulatory Visit | Attending: Internal Medicine | Admitting: Internal Medicine

## 2023-05-18 DIAGNOSIS — Z1231 Encounter for screening mammogram for malignant neoplasm of breast: Secondary | ICD-10-CM | POA: Diagnosis not present

## 2023-05-26 ENCOUNTER — Other Ambulatory Visit (HOSPITAL_COMMUNITY): Payer: Self-pay

## 2023-05-26 ENCOUNTER — Other Ambulatory Visit: Payer: Self-pay | Admitting: Internal Medicine

## 2023-05-26 ENCOUNTER — Other Ambulatory Visit: Payer: Self-pay

## 2023-05-26 MED ORDER — NOVOLOG FLEXPEN 100 UNIT/ML ~~LOC~~ SOPN
12.0000 [IU] | PEN_INJECTOR | Freq: Three times a day (TID) | SUBCUTANEOUS | 3 refills | Status: AC
  Filled 2023-05-26: qty 30, 84d supply, fill #0
  Filled 2023-08-25: qty 30, 84d supply, fill #1
  Filled 2023-11-24: qty 30, 84d supply, fill #2
  Filled 2024-02-23: qty 30, 84d supply, fill #3

## 2023-05-26 MED ORDER — CARVEDILOL 12.5 MG PO TABS
12.5000 mg | ORAL_TABLET | Freq: Two times a day (BID) | ORAL | 5 refills | Status: DC
  Filled 2023-05-26: qty 60, 30d supply, fill #0
  Filled 2023-06-16: qty 180, 90d supply, fill #1
  Filled 2023-09-14: qty 120, 60d supply, fill #2

## 2023-05-27 ENCOUNTER — Other Ambulatory Visit: Payer: Self-pay

## 2023-06-16 ENCOUNTER — Other Ambulatory Visit (HOSPITAL_COMMUNITY): Payer: Self-pay

## 2023-06-16 ENCOUNTER — Other Ambulatory Visit: Payer: Self-pay

## 2023-06-18 ENCOUNTER — Other Ambulatory Visit: Payer: Self-pay

## 2023-06-28 ENCOUNTER — Telehealth: Payer: Self-pay | Admitting: *Deleted

## 2023-06-28 NOTE — Telephone Encounter (Signed)
 Copied from CRM [id number]. Topic: General - Other >> Jun 24, 2023  4:28 PM Elle L wrote: Reason for CRM: The patient is a requesting a letter to be excused from jury duty that begins on 5/6 and they need it two weeks before. The patient's call back number is [phone].

## 2023-06-29 NOTE — Telephone Encounter (Signed)
 Spoke to patient and relayed this information to her.

## 2023-07-07 ENCOUNTER — Other Ambulatory Visit: Payer: Self-pay

## 2023-07-07 ENCOUNTER — Other Ambulatory Visit: Payer: Self-pay | Admitting: Internal Medicine

## 2023-07-07 DIAGNOSIS — E119 Type 2 diabetes mellitus without complications: Secondary | ICD-10-CM

## 2023-07-07 MED ORDER — DEXCOM G7 SENSOR MISC
3 refills | Status: AC
  Filled 2023-07-07: qty 9, 90d supply, fill #0
  Filled 2023-10-12: qty 9, 90d supply, fill #1
  Filled 2024-01-05: qty 9, 90d supply, fill #2
  Filled 2024-04-03: qty 9, 90d supply, fill #3

## 2023-07-12 ENCOUNTER — Other Ambulatory Visit: Payer: Medicare HMO

## 2023-07-12 DIAGNOSIS — Z0000 Encounter for general adult medical examination without abnormal findings: Secondary | ICD-10-CM

## 2023-07-12 DIAGNOSIS — I10 Essential (primary) hypertension: Secondary | ICD-10-CM | POA: Diagnosis not present

## 2023-07-12 DIAGNOSIS — E119 Type 2 diabetes mellitus without complications: Secondary | ICD-10-CM | POA: Diagnosis not present

## 2023-07-12 DIAGNOSIS — R5383 Other fatigue: Secondary | ICD-10-CM | POA: Diagnosis not present

## 2023-07-12 DIAGNOSIS — Z794 Long term (current) use of insulin: Secondary | ICD-10-CM | POA: Diagnosis not present

## 2023-07-14 NOTE — Progress Notes (Signed)
 Annual Wellness Visit   Patient Care Team: Sylvan Evener, MD as PCP - General (Internal Medicine)  Visit Date: 07/16/23   Chief Complaint  Patient presents with   Annual Exam   Subjective:  Patient: Lisa Robertson, Female DOB: Dec 26, 1955, 68 y.o. MRN: [id number] Lisa Robertson is a 68 y.o. Female who presents today for her Annual Wellness Visit. Patient has Insulin  Dependent Diabetes Mellitus; Essential Hypertension; Obesity; Metabolic Syndrome; Hx of Adenomatous Polyp of Colon; BMI 29.0-29.9,Adult; and History of Arthroplasty, Left Knee.  Says that she has been having some infrequent leaking urine incontinence, doesn't occur daily.  History of Hypertension treated with Atenolol  50 mg daily, Carvedilol  12.5 mg twice daily, Diltiazem  300 mg daily, HCTZ 12.5 mg twice daily, and Olmesartan  40 mg daily. Blood Pressure: normotensive today at 118/78.   History of Hyperlipidemia treated with Simvastatin  20 mg daily. 07/12/2023 Lipid Panel: HDL 45  History of Diabetes Mellitus, type II treated with Novolog  12 units three times daily, Metformin  500 mg twice daily, and Lantus  50 units daily.  01/05/2023 HgbA1c 7.6; 07/12/2023 Glucose 146, decreased from 159. Wears glasses, and had her eye exam on 02/07/2023 with [facility]   S/p Left Knee Arthroplasty in 08-03-17 by Dr. Dalldorf; History of Trigger Finger, 4th Right evaluated by Dr. Ana Balling; Plantar Fasciitis treated with Meloxicam  15 mg daily.   Labs 07/12/2023 CBC: WNL CMP, compared to 06/2022: Glucose 146, decreased from 159; AST 39, decreased from 42; ALT 48, elevated from 41.   TSH: 1.41  S/p Abdominal Hysterectomy w/o Oophorectomy for Fibroids by Dr. Andree Kayser 1998.   Mammogram 05/21/2023  normal  with repeat recommendation of 08-03-24.  Colonoscopy 02/02/2023 removed 3 sessile polyps - 2 from the ascending colon found to be a fragment of colonic mucosa w/ prominent lymphoid aggregates and focal pseudo-lipomatosis and a  separate mildly polypoid fragment of colonic mucosa w/ cryptitis/mild activity and associated reactive/reparative change and minimal surface erosion; from the sigmoid colon the single polyp removed was found to be hyperplastic; Hemorrhoids on perianal exam; Multiple Diverticula found in the left colon. No repeat recommended.   Bone Density 06/16/2021  T-score Left Femur Neck -0.8, normal.   Vaccine Counseling: UTD on Flu, Shingles 2/2, PNA, and Tdap. Past Medical History:  Diagnosis Date   Allergy    Arthritis    Colon polyp 10/16/2005   inflamed, Dr. Baldo Bonds   DM (diabetes mellitus) [facility])    Hx of adenomatous polyp of colon 02/06/2018   Hyperlipemia    Hypertension    Medical/Surgical History Narrative:  Allergic/Intolerant to: No Known Allergies  1983 - Tubal Ligation Family History  Problem Relation Age of Onset   Diabetes Mother    Diabetes Father    Heart disease Father    Prostate cancer Father    Diabetes Sister        x2   Diabetes Maternal Grandmother    Diabetes Paternal Grandmother    Colon cancer Neg Hx    Breast cancer Neg Hx    Esophageal cancer Neg Hx    Rectal cancer Neg Hx    Stomach cancer Neg Hx    Colon polyps Neg Hx    Family History Narrative: No Family History of GI Cancers, Breast Cancer, or Colon Polyps.  Paternal Grandmother w/ hx of Diabetes Father w/ hx of Diabetes, Hypertension, Heart Disease, and Prostate Cancer Maternal Grandmother w/ hx of Diabetes Mother, deceased om 08-03-2016 due to a Stroke w/ hx of  Diabetes and Hypertension 3 Sisters - 1 w/ hx of Diabetes Son w/ hx of Kidney Transplant Social History   Social History Narrative   Retired from Engineer, manufacturing. 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 2 sons and 1 daughter. Non-smoker or drinker.    Review of Systems  Constitutional:  Negative for chills, fever, malaise/fatigue and weight loss.  HENT:  Negative for hearing loss, sinus pain and  sore throat.   Respiratory:  Negative for cough, hemoptysis and shortness of breath.   Cardiovascular:  Negative for chest pain, palpitations, leg swelling and PND.  Gastrointestinal:  Negative for abdominal pain, constipation, diarrhea, heartburn, nausea and vomiting.  Genitourinary:  Negative for dysuria, frequency and urgency.  Musculoskeletal:  Negative for back pain, myalgias and neck pain.  Skin:  Negative for itching and rash.  Neurological:  Negative for dizziness, tingling, seizures and headaches.  Endo/Heme/Allergies:  Negative for polydipsia.  Psychiatric/Behavioral:  Negative for depression. The patient is not nervous/anxious.     Objective:  Vitals: BP 118/78   Pulse 78   Temp 98.1 F (36.7 C) (Temporal)   Ht 5' 3.5" (1.613 m)   Wt 174 lb 12.8 oz (79.3 kg)   SpO2 98%   BMI 30.48 kg/m  Physical Exam Vitals and nursing note reviewed.  Constitutional:      General: She is not in acute distress.    Appearance: Normal appearance. She is not ill-appearing or toxic-appearing.  HENT:     Head: Normocephalic and atraumatic.     Right Ear: Hearing, tympanic membrane, ear canal and external ear normal.     Left Ear: Hearing, tympanic membrane, ear canal and external ear normal.     Mouth/Throat:     Pharynx: Oropharynx is clear.  Eyes:     Extraocular Movements: Extraocular movements intact.     Pupils: Pupils are equal, round, and reactive to light.  Neck:     Thyroid: No thyroid mass, thyromegaly or thyroid tenderness.     Vascular: No carotid bruit.  Cardiovascular:     Rate and Rhythm: Normal rate and regular rhythm. No extrasystoles are present.    Pulses:          Dorsalis pedis pulses are 1+ on the right side and 1+ on the left side.       Posterior tibial pulses are 1+ on the right side and 1+ on the left side.     Heart sounds: Normal heart sounds. No murmur heard.    No friction rub. No gallop.  Pulmonary:     Effort: Pulmonary effort is normal.     Breath  sounds: Normal breath sounds. No decreased breath sounds, wheezing, rhonchi or rales.  Chest:     Chest wall: No mass.  Abdominal:     Palpations: Abdomen is soft. There is no hepatomegaly, splenomegaly or mass.     Tenderness: There is no abdominal tenderness.     Hernia: No hernia is present.  Genitourinary:    Adnexa: Right adnexa normal and left adnexa normal.     Comments: Bimanual PAP normal Musculoskeletal:     Cervical back: Normal range of motion.     Right lower leg: No edema.     Left lower leg: No edema.  Feet:     Right foot:     Skin integrity: Skin integrity normal. No ulcer, blister, skin breakdown, erythema, warmth, callus, dry skin or fissure.     Toenail Condition: Right  toenails are normal.     Left foot:     Skin integrity: Skin integrity normal. No ulcer, blister, skin breakdown, erythema, warmth, callus, dry skin or fissure.     Toenail Condition: Left toenails are normal.  Lymphadenopathy:     Cervical: No cervical adenopathy.     Upper Body:     Right upper body: No supraclavicular adenopathy.     Left upper body: No supraclavicular adenopathy.  Skin:    General: Skin is warm and dry.  Neurological:     General: No focal deficit present.     Mental Status: She is alert and oriented to person, place, and time. Mental status is at baseline.     Sensory: Sensation is intact.     Motor: Motor function is intact. No weakness.     Deep Tendon Reflexes: Reflexes are normal and symmetric.  Psychiatric:        Attention and Perception: Attention normal.        Mood and Affect: Mood normal.        Speech: Speech normal.        Behavior: Behavior normal.        Thought Content: Thought content normal.        Cognition and Memory: Cognition normal.        Judgment: Judgment normal.   Most Recent Functional Status Assessment:    07/16/2023   11:17 AM  In your present state of health, do you have any difficulty performing the following activities:  Hearing? 0   Vision? 0  Difficulty concentrating or making decisions? 0  Walking or climbing stairs? 0  Dressing or bathing? 0  Doing errands, shopping? 0  Preparing Food and eating ? N  Using the Toilet? N  In the past six months, have you accidently leaked urine? Y  Do you have problems with loss of bowel control? N  Managing your Medications? N  Managing your Finances? N  Housekeeping or managing your Housekeeping? N   Most Recent Fall Risk Assessment:    07/16/2023   11:23 AM  Fall Risk   Falls in the past year? 0  Number falls in past yr: 0  Injury with Fall? 0  Risk for fall due to : No Fall Risks  Follow up Falls evaluation completed   Most Recent Depression Screenings:    07/16/2023   11:46 AM 07/03/2022   10:11 AM  PHQ 2/9 Scores  PHQ - 2 Score 0 0   Most Recent Cognitive Screening:    07/16/2023   11:24 AM  6CIT Screen  What Year? 4 points  What month? 3 points  What time? 3 points  Count back from 20 4 points  Months in reverse 4 points  Repeat phrase 10 points  Total Score 28 points   Results:  Studies Obtained And Personally Reviewed By Me:  Diabetic Foot Exam - Simple   Simple Foot Form Diabetic Foot exam was performed with the following findings: Yes 07/16/2023 11:42 AM  Visual Inspection No deformities, no ulcerations, no other skin breakdown bilaterally: Yes Sensation Testing Intact to touch and monofilament testing bilaterally: Yes Pulse Check Posterior Tibialis and Dorsalis pulse intact bilaterally: Yes Comments    Mammogram 05/21/2023  normal.  Colonoscopy 02/02/2023 removed 3 sessile polyps - 2 from the ascending colon found to be a fragment of colonic mucosa w/ prominent lymphoid aggregates and focal pseudo-lipomatosis and a separate mildly polypoid fragment of colonic mucosa w/ cryptitis/mild activity and associated  reactive/reparative change and minimal surface erosion; from the sigmoid colon the single polyp removed was found to be hyperplastic;  Hemorrhoids on perianal exam; Multiple Diverticula found in the left colon.  Bone Density 06/16/2021  T-score Left Femur Neck -0.8, normal.   Labs:     Component Value Date/Time   NA 137 07/12/2023 1205   K 4.3 07/12/2023 1205   CL 99 07/12/2023 1205   CO2 27 07/12/2023 1205   GLUCOSE 146 (H) 07/12/2023 1205   BUN 23 07/12/2023 1205   CREATININE 0.80 07/12/2023 1205   CALCIUM 10.3 07/12/2023 1205   PROT 7.7 07/12/2023 1205   ALBUMIN 4.0 05/29/2016 0900   AST 39 (H) 07/12/2023 1205   ALT 48 (H) 07/12/2023 1205   ALKPHOS 52 05/29/2016 0900   BILITOT 0.3 07/12/2023 1205   GFRNONAA 86 06/14/2020 0930   GFRAA 99 06/14/2020 0930    Lab Results  Component Value Date   WBC 7.5 07/12/2023   HGB 14.2 07/12/2023   HCT 43.5 07/12/2023   MCV 88.4 07/12/2023   PLT 267 07/12/2023   Lab Results  Component Value Date   CHOL 166 07/12/2023   HDL 45 (L) 07/12/2023   LDLCALC 96 07/12/2023   TRIG 145 07/12/2023   CHOLHDL 3.7 07/12/2023   Lab Results  Component Value Date   HGBA1C 7.6 (H) 01/05/2023    Lab Results  Component Value Date   TSH 1.41 07/12/2023    Assessment & Plan:   Orders Placed This Encounter  Procedures   Microalbumin / creatinine urine ratio   POCT urinalysis dipstick  No orders of the defined types were placed in this encounter. Other Labs Reviewed today: CBC: WNL CMP, compared to 06/2022: Glucose 146, decreased from 159; AST 39, decreased from 42; ALT 48, elevated from 41.   TSH: 1.41  Says that she has been having some infrequent leaking Urine Incontinence, doesn't occur daily. She will be going out of town in a few days, but will call if she'd like to have something sent in.   Hypertension treated with Atenolol  50 mg daily, Carvedilol  12.5 mg twice daily, Diltiazem  300 mg daily, HCTZ 12.5 mg twice daily, and Olmesartan  40 mg daily. Blood Pressure: normotensive today at 118/78.   Hyperlipidemia treated with Simvastatin  20 mg daily. 07/12/2023 Lipid Panel:  HDL 45  Elevated LFTs thought to be due to obesity I.e. non-alcoholic fatty liver. Continue to monitor. Had ultrasound 2017 for this.  Diabetes Mellitus, type II treated with Novolog  12 units three times daily, Metformin  500 mg twice daily, and Lantus  50 units daily.  01/05/2023 HgbA1c 7.6; 07/12/2023 Glucose 146, decreased from 159. Wears glasses, and had her eye exam on 02/07/2023 with [facility].   S/p Left Knee Arthroplasty in 2019 by Dr. Dalldorf; History of Trigger Finger, 4th Right evaluated by Dr. Ana Balling; Plantar Fasciitis treated with Meloxicam  15 mg daily.   S/p Abdominal Hysterectomy w/o Oophorectomy for Fibroids by Dr. Andree Kayser 1998.   Mammogram 05/21/2023  normal  with repeat recommendation of 2026.  Colonoscopy 02/02/2023 removed 3 sessile polyps - 2 from the ascending colon found to be a fragment of colonic mucosa w/ prominent lymphoid aggregates and focal pseudo-lipomatosis and a separate mildly polypoid fragment of colonic mucosa w/ cryptitis/mild activity and associated reactive/reparative change and minimal surface erosion; from the sigmoid colon the single polyp removed was found to be hyperplastic; Hemorrhoids on perianal exam; Multiple Diverticula found in the left colon. No repeat recommended.   Bone  Density 06/16/2021  T-score Left Femur Neck -0.8, normal.   Vaccine Counseling: UTD on Flu, Shingles 2/2, PNA, and Tdap.   Annual wellness visit done today including the all of the following: Reviewed patient's Family Medical History Reviewed and updated list of patient's medical providers Assessment of cognitive impairment was done Assessed patient's functional ability Established a written schedule for health screening services Health Risk Assessent Completed and Reviewed  Discussed health benefits of physical activity, and encouraged her to engage in regular exercise appropriate for her age and condition.    I,Emily Lagle,acting as a Neurosurgeon for Sylvan Evener,  MD.,have documented all relevant documentation on the behalf of Sylvan Evener, MD,as directed by  Sylvan Evener, MD while in the presence of Sylvan Evener, MD.   I, Sylvan Evener, MD, have reviewed all documentation for this visit. The documentation on 07/25/23 for the exam, diagnosis, procedures, and orders are all accurate and complete.

## 2023-07-16 ENCOUNTER — Ambulatory Visit (INDEPENDENT_AMBULATORY_CARE_PROVIDER_SITE_OTHER): Payer: Medicare HMO | Admitting: Internal Medicine

## 2023-07-16 ENCOUNTER — Encounter: Payer: Self-pay | Admitting: Internal Medicine

## 2023-07-16 VITALS — BP 118/78 | HR 78 | Temp 98.1°F | Ht 63.5 in | Wt 174.8 lb

## 2023-07-16 DIAGNOSIS — E119 Type 2 diabetes mellitus without complications: Secondary | ICD-10-CM | POA: Diagnosis not present

## 2023-07-16 DIAGNOSIS — N289 Disorder of kidney and ureter, unspecified: Secondary | ICD-10-CM | POA: Diagnosis not present

## 2023-07-16 DIAGNOSIS — D126 Benign neoplasm of colon, unspecified: Secondary | ICD-10-CM

## 2023-07-16 DIAGNOSIS — K760 Fatty (change of) liver, not elsewhere classified: Secondary | ICD-10-CM

## 2023-07-16 DIAGNOSIS — Z6830 Body mass index (BMI) 30.0-30.9, adult: Secondary | ICD-10-CM

## 2023-07-16 DIAGNOSIS — E785 Hyperlipidemia, unspecified: Secondary | ICD-10-CM

## 2023-07-16 DIAGNOSIS — I10 Essential (primary) hypertension: Secondary | ICD-10-CM | POA: Diagnosis not present

## 2023-07-16 DIAGNOSIS — Z794 Long term (current) use of insulin: Secondary | ICD-10-CM

## 2023-07-16 DIAGNOSIS — E1169 Type 2 diabetes mellitus with other specified complication: Secondary | ICD-10-CM | POA: Insufficient documentation

## 2023-07-16 DIAGNOSIS — Z96652 Presence of left artificial knee joint: Secondary | ICD-10-CM

## 2023-07-16 DIAGNOSIS — Z0000 Encounter for general adult medical examination without abnormal findings: Secondary | ICD-10-CM | POA: Diagnosis not present

## 2023-07-16 LAB — TSH: TSH: 1.41 m[IU]/L (ref 0.40–4.50)

## 2023-07-16 LAB — COMPLETE METABOLIC PANEL WITHOUT GFR
AG Ratio: 1.4 (calc) (ref 1.0–2.5)
ALT: 48 U/L — ABNORMAL HIGH (ref 6–29)
AST: 39 U/L — ABNORMAL HIGH (ref 10–35)
Albumin: 4.5 g/dL (ref 3.6–5.1)
Alkaline phosphatase (APISO): 55 U/L (ref 37–153)
BUN: 23 mg/dL (ref 7–25)
CO2: 27 mmol/L (ref 20–32)
Calcium: 10.3 mg/dL (ref 8.6–10.4)
Chloride: 99 mmol/L (ref 98–110)
Creat: 0.8 mg/dL (ref 0.50–1.05)
Globulin: 3.2 g/dL (ref 1.9–3.7)
Glucose, Bld: 146 mg/dL — ABNORMAL HIGH (ref 65–99)
Potassium: 4.3 mmol/L (ref 3.5–5.3)
Sodium: 137 mmol/L (ref 135–146)
Total Bilirubin: 0.3 mg/dL (ref 0.2–1.2)
Total Protein: 7.7 g/dL (ref 6.1–8.1)

## 2023-07-16 LAB — CBC WITH DIFFERENTIAL/PLATELET
Absolute Lymphocytes: 3330 {cells}/uL (ref 850–3900)
Absolute Monocytes: 458 {cells}/uL (ref 200–950)
Basophils Absolute: 38 {cells}/uL (ref 0–200)
Basophils Relative: 0.5 %
Eosinophils Absolute: 338 {cells}/uL (ref 15–500)
Eosinophils Relative: 4.5 %
HCT: 43.5 % (ref 35.0–45.0)
Hemoglobin: 14.2 g/dL (ref 11.7–15.5)
MCH: 28.9 pg (ref 27.0–33.0)
MCHC: 32.6 g/dL (ref 32.0–36.0)
MCV: 88.4 fL (ref 80.0–100.0)
MPV: 11 fL (ref 7.5–12.5)
Monocytes Relative: 6.1 %
Neutro Abs: 3338 {cells}/uL (ref 1500–7800)
Neutrophils Relative %: 44.5 %
Platelets: 267 10*3/uL (ref 140–400)
RBC: 4.92 10*6/uL (ref 3.80–5.10)
RDW: 13.3 % (ref 11.0–15.0)
Total Lymphocyte: 44.4 %
WBC: 7.5 10*3/uL (ref 3.8–10.8)

## 2023-07-16 LAB — POCT URINALYSIS DIPSTICK
Bilirubin, UA: NEGATIVE
Blood, UA: NEGATIVE
Glucose, UA: NEGATIVE
Ketones, UA: NEGATIVE
Leukocytes, UA: NEGATIVE
Nitrite, UA: NEGATIVE
Protein, UA: NEGATIVE
Spec Grav, UA: 1.01 (ref 1.010–1.025)
Urobilinogen, UA: 0.2 U/dL
pH, UA: 7 (ref 5.0–8.0)

## 2023-07-16 LAB — LIPID PANEL
Cholesterol: 166 mg/dL (ref <200)
HDL: 45 mg/dL — ABNORMAL LOW (ref >=50)
LDL Cholesterol (Calc): 96 mg/dL
Non-HDL Cholesterol (Calc): 121 mg/dL (ref <130)
Total CHOL/HDL Ratio: 3.7 (calc) (ref <5.0)
Triglycerides: 145 mg/dL (ref <150)

## 2023-07-16 LAB — TEST AUTHORIZATION

## 2023-07-16 LAB — HEMOGLOBIN A1C W/OUT EAG: Hgb A1c MFr Bld: 7.9 % — ABNORMAL HIGH (ref <5.7)

## 2023-07-16 NOTE — Telephone Encounter (Signed)
 Refill request sent to me was an error

## 2023-07-17 LAB — MICROALBUMIN / CREATININE URINE RATIO
Creatinine, Urine: 87 mg/dL (ref 20–275)
Microalb Creat Ratio: 5 mg/g{creat} (ref <30)
Microalb, Ur: 0.4 mg/dL

## 2023-07-25 ENCOUNTER — Encounter: Payer: Self-pay | Admitting: Internal Medicine

## 2023-07-25 NOTE — Patient Instructions (Signed)
 Continue to work on diet exercise and weight loss. Continue same medications. Labs are stable. Try to lose some weight. As always, it is a pleasure to see you today.

## 2023-07-27 ENCOUNTER — Other Ambulatory Visit: Payer: Self-pay | Admitting: Internal Medicine

## 2023-07-27 ENCOUNTER — Other Ambulatory Visit: Payer: Self-pay

## 2023-07-27 ENCOUNTER — Other Ambulatory Visit (HOSPITAL_COMMUNITY): Payer: Self-pay

## 2023-07-27 MED ORDER — DROPLET PEN NEEDLES 32G X 4 MM MISC
3 refills | Status: AC
  Filled 2023-07-27: qty 300, 75d supply, fill #0
  Filled 2023-10-21: qty 400, 90d supply, fill #1
  Filled 2023-10-22: qty 300, 75d supply, fill #1
  Filled 2024-02-23: qty 300, 75d supply, fill #2

## 2023-07-27 MED ORDER — OLMESARTAN MEDOXOMIL 40 MG PO TABS
40.0000 mg | ORAL_TABLET | Freq: Every day | ORAL | 3 refills | Status: AC
  Filled 2023-07-27: qty 90, 90d supply, fill #0
  Filled 2023-10-21: qty 90, 90d supply, fill #1
  Filled 2024-01-20: qty 90, 90d supply, fill #2

## 2023-07-29 DIAGNOSIS — E7800 Pure hypercholesterolemia, unspecified: Secondary | ICD-10-CM | POA: Diagnosis not present

## 2023-07-29 DIAGNOSIS — I10 Essential (primary) hypertension: Secondary | ICD-10-CM | POA: Diagnosis not present

## 2023-07-29 DIAGNOSIS — E1165 Type 2 diabetes mellitus with hyperglycemia: Secondary | ICD-10-CM | POA: Diagnosis not present

## 2023-07-29 DIAGNOSIS — E559 Vitamin D deficiency, unspecified: Secondary | ICD-10-CM | POA: Diagnosis not present

## 2023-07-29 DIAGNOSIS — E11649 Type 2 diabetes mellitus with hypoglycemia without coma: Secondary | ICD-10-CM | POA: Diagnosis not present

## 2023-08-25 ENCOUNTER — Other Ambulatory Visit: Payer: Self-pay | Admitting: Internal Medicine

## 2023-08-25 ENCOUNTER — Other Ambulatory Visit (HOSPITAL_COMMUNITY): Payer: Self-pay

## 2023-08-25 ENCOUNTER — Other Ambulatory Visit: Payer: Self-pay

## 2023-08-25 MED ORDER — MELOXICAM 15 MG PO TABS
15.0000 mg | ORAL_TABLET | Freq: Every day | ORAL | 1 refills | Status: DC
  Filled 2023-08-25: qty 90, 90d supply, fill #0

## 2023-09-14 ENCOUNTER — Other Ambulatory Visit: Payer: Self-pay

## 2023-09-23 ENCOUNTER — Other Ambulatory Visit: Payer: Self-pay | Admitting: Internal Medicine

## 2023-09-23 ENCOUNTER — Other Ambulatory Visit: Payer: Self-pay

## 2023-09-23 ENCOUNTER — Other Ambulatory Visit (HOSPITAL_COMMUNITY): Payer: Self-pay

## 2023-09-23 DIAGNOSIS — E119 Type 2 diabetes mellitus without complications: Secondary | ICD-10-CM

## 2023-09-23 MED ORDER — DEXCOM G7 RECEIVER DEVI
0 refills | Status: AC
  Filled 2023-09-23: qty 1, 90d supply, fill #0

## 2023-09-24 ENCOUNTER — Other Ambulatory Visit: Payer: Self-pay

## 2023-09-27 ENCOUNTER — Other Ambulatory Visit: Payer: Self-pay

## 2023-09-28 ENCOUNTER — Other Ambulatory Visit (HOSPITAL_COMMUNITY): Payer: Self-pay

## 2023-09-28 ENCOUNTER — Other Ambulatory Visit: Payer: Self-pay

## 2023-10-12 ENCOUNTER — Other Ambulatory Visit: Payer: Self-pay

## 2023-10-21 ENCOUNTER — Other Ambulatory Visit: Payer: Self-pay

## 2023-10-21 ENCOUNTER — Other Ambulatory Visit (HOSPITAL_COMMUNITY): Payer: Self-pay

## 2023-10-22 ENCOUNTER — Other Ambulatory Visit: Payer: Self-pay

## 2023-10-22 ENCOUNTER — Other Ambulatory Visit (HOSPITAL_COMMUNITY): Payer: Self-pay

## 2023-10-27 ENCOUNTER — Other Ambulatory Visit (HOSPITAL_COMMUNITY): Payer: Self-pay

## 2023-10-27 ENCOUNTER — Other Ambulatory Visit: Payer: Self-pay

## 2023-10-27 DIAGNOSIS — M25521 Pain in right elbow: Secondary | ICD-10-CM | POA: Diagnosis not present

## 2023-10-27 MED ORDER — CELECOXIB 200 MG PO CAPS
200.0000 mg | ORAL_CAPSULE | Freq: Every day | ORAL | 0 refills | Status: DC | PRN
  Filled 2023-10-27 (×2): qty 30, 30d supply, fill #0

## 2023-11-04 DIAGNOSIS — E7800 Pure hypercholesterolemia, unspecified: Secondary | ICD-10-CM | POA: Diagnosis not present

## 2023-11-04 DIAGNOSIS — E1165 Type 2 diabetes mellitus with hyperglycemia: Secondary | ICD-10-CM | POA: Diagnosis not present

## 2023-11-04 DIAGNOSIS — E11649 Type 2 diabetes mellitus with hypoglycemia without coma: Secondary | ICD-10-CM | POA: Diagnosis not present

## 2023-11-04 DIAGNOSIS — I10 Essential (primary) hypertension: Secondary | ICD-10-CM | POA: Diagnosis not present

## 2023-11-04 DIAGNOSIS — E559 Vitamin D deficiency, unspecified: Secondary | ICD-10-CM | POA: Diagnosis not present

## 2023-11-24 ENCOUNTER — Other Ambulatory Visit: Payer: Self-pay

## 2023-11-24 ENCOUNTER — Other Ambulatory Visit: Payer: Self-pay | Admitting: Internal Medicine

## 2023-11-24 ENCOUNTER — Other Ambulatory Visit (HOSPITAL_COMMUNITY): Payer: Self-pay

## 2023-11-24 MED ORDER — CELECOXIB 200 MG PO CAPS
200.0000 mg | ORAL_CAPSULE | Freq: Every day | ORAL | 0 refills | Status: AC | PRN
  Filled 2023-11-24: qty 30, 30d supply, fill #0

## 2023-11-25 ENCOUNTER — Other Ambulatory Visit: Payer: Self-pay

## 2023-11-25 ENCOUNTER — Other Ambulatory Visit (HOSPITAL_COMMUNITY): Payer: Self-pay

## 2023-11-25 MED ORDER — CARVEDILOL 12.5 MG PO TABS
12.5000 mg | ORAL_TABLET | Freq: Two times a day (BID) | ORAL | 5 refills | Status: DC
  Filled 2023-11-25: qty 60, 30d supply, fill #0
  Filled 2023-12-20: qty 180, 90d supply, fill #1

## 2023-11-25 MED ORDER — KETOCONAZOLE 2 % EX CREA
1.0000 | TOPICAL_CREAM | Freq: Every day | CUTANEOUS | 99 refills | Status: AC
  Filled 2023-11-25: qty 15, 15d supply, fill #0
  Filled 2024-02-23: qty 15, 15d supply, fill #1
  Filled 2024-03-20: qty 15, 15d supply, fill #2
  Filled 2024-04-19: qty 15, 15d supply, fill #3

## 2023-12-03 ENCOUNTER — Other Ambulatory Visit: Payer: Self-pay | Admitting: Internal Medicine

## 2023-12-03 ENCOUNTER — Other Ambulatory Visit (HOSPITAL_COMMUNITY): Payer: Self-pay

## 2023-12-03 ENCOUNTER — Other Ambulatory Visit: Payer: Self-pay

## 2023-12-03 DIAGNOSIS — E119 Type 2 diabetes mellitus without complications: Secondary | ICD-10-CM

## 2023-12-03 MED ORDER — ACCU-CHEK GUIDE W/DEVICE KIT
PACK | 0 refills | Status: AC
  Filled 2023-12-03: qty 1, 30d supply, fill #0

## 2023-12-20 ENCOUNTER — Other Ambulatory Visit: Payer: Self-pay

## 2023-12-20 ENCOUNTER — Other Ambulatory Visit (HOSPITAL_COMMUNITY): Payer: Self-pay

## 2023-12-20 ENCOUNTER — Other Ambulatory Visit: Payer: Self-pay | Admitting: Internal Medicine

## 2023-12-20 MED ORDER — LANTUS SOLOSTAR 100 UNIT/ML ~~LOC~~ SOPN
50.0000 [IU] | PEN_INJECTOR | Freq: Every day | SUBCUTANEOUS | 2 refills | Status: AC
  Filled 2023-12-20: qty 45, 90d supply, fill #0
  Filled 2024-03-20: qty 45, 90d supply, fill #1

## 2023-12-22 ENCOUNTER — Other Ambulatory Visit: Payer: Self-pay

## 2024-01-05 ENCOUNTER — Other Ambulatory Visit (HOSPITAL_COMMUNITY): Payer: Self-pay

## 2024-01-05 ENCOUNTER — Other Ambulatory Visit: Payer: Self-pay

## 2024-01-05 ENCOUNTER — Telehealth (HOSPITAL_COMMUNITY): Payer: Self-pay

## 2024-01-05 NOTE — Telephone Encounter (Signed)
 Pharmacy Patient Advocate Encounter   Received notification from Pt Calls Messages that prior authorization for Dexcom G7 Sensor  is required/requested.   Insurance verification completed.   The patient is insured through [facility].   Per test claim: PA required; PA submitted to above mentioned insurance via Latent Key/confirmation #/EOC [id number] Status is pending

## 2024-01-05 NOTE — Telephone Encounter (Signed)
 PA request has been Received. New Encounter has been or will be created for follow up. For additional info see Pharmacy Prior Auth telephone encounter from 01/05/24.

## 2024-01-06 ENCOUNTER — Other Ambulatory Visit: Payer: Self-pay

## 2024-01-06 ENCOUNTER — Other Ambulatory Visit (HOSPITAL_COMMUNITY): Payer: Self-pay

## 2024-01-06 NOTE — Telephone Encounter (Signed)
 Pharmacy Patient Advocate Encounter  Received notification from [facility] that Prior Authorization for Dexcom G7 Sensor  has been APPROVED from 1022/25 to 01/04/25. Ran test claim, Copay is $141. This test claim was processed through [facility]- copay amounts may vary at other pharmacies due to pharmacy/plan contracts, or as the patient moves through the different stages of their insurance plan.   PA #/Case ID/Reference #: [id number]

## 2024-01-18 ENCOUNTER — Other Ambulatory Visit: Payer: Self-pay

## 2024-01-18 DIAGNOSIS — Z794 Long term (current) use of insulin: Secondary | ICD-10-CM | POA: Diagnosis not present

## 2024-01-18 DIAGNOSIS — I10 Essential (primary) hypertension: Secondary | ICD-10-CM | POA: Diagnosis not present

## 2024-01-18 DIAGNOSIS — E1169 Type 2 diabetes mellitus with other specified complication: Secondary | ICD-10-CM | POA: Diagnosis not present

## 2024-01-18 DIAGNOSIS — E785 Hyperlipidemia, unspecified: Secondary | ICD-10-CM | POA: Diagnosis not present

## 2024-01-18 DIAGNOSIS — E119 Type 2 diabetes mellitus without complications: Secondary | ICD-10-CM

## 2024-01-18 LAB — HEPATIC FUNCTION PANEL
AG Ratio: 1.4 (calc) (ref 1.0–2.5)
ALT: 34 U/L — ABNORMAL HIGH (ref 6–29)
AST: 29 U/L (ref 10–35)
Albumin: 4.3 g/dL (ref 3.6–5.1)
Alkaline phosphatase (APISO): 51 U/L (ref 37–153)
Bilirubin, Direct: 0.1 mg/dL (ref 0.0–0.2)
Globulin: 3.1 g/dL (ref 1.9–3.7)
Indirect Bilirubin: 0.3 mg/dL (ref 0.2–1.2)
Total Bilirubin: 0.4 mg/dL (ref 0.2–1.2)
Total Protein: 7.4 g/dL (ref 6.1–8.1)

## 2024-01-18 LAB — LIPID PANEL
Cholesterol: 141 mg/dL (ref <200)
HDL: 44 mg/dL — ABNORMAL LOW (ref >=50)
LDL Cholesterol (Calc): 76 mg/dL
Non-HDL Cholesterol (Calc): 97 mg/dL (ref <130)
Total CHOL/HDL Ratio: 3.2 (calc) (ref <5.0)
Triglycerides: 119 mg/dL (ref <150)

## 2024-01-18 LAB — HEMOGLOBIN A1C
Hgb A1c MFr Bld: 7.6 % — ABNORMAL HIGH (ref <5.7)
Mean Plasma Glucose: 171 mg/dL
eAG (mmol/L): 9.5 mmol/L

## 2024-01-18 NOTE — Progress Notes (Signed)
 Patient Care Team: Perri Ronal PARAS, MD as PCP - General (Internal Medicine)  Visit Date: 01/20/24  Subjective:    Patient ID: Lisa Robertson , Female   DOB: 1956/02/11, 68 y.o.    MRN: [id number]   68 y.o. Female presents today for 6 month follow up for Hypertension, Hyperlipidemia, Diabetes Mellitus. Patient has a past medical history of Obesity; Metabolic Syndrome; Hx of Adenomatous Polyp of Colon; BMI 29.0-29.9,Adult; and History of Arthroplasty, Left Knee.  History of Hypertension treated with Atenolol  50 mg daily, Carvedilol  12.5 mg twice daily, Diltiazem  300 mg daily, HCTZ 12.5 mg twice daily, and Olmesartan  40 mg daily.  Blood pressure today is normal at 120/80.   History of Hyperlipidemia treated with Simvastatin  20 mg daily. 01/18/2024 Lipid Panel HDL 44, otherwise WNL.   History of Diabetes Mellitus, Type II treated with Novolog  12 units three times daily, Metformin  500 mg twice daily, and lantus  50 units daily. 01/18/2024 HgbA1c 7.6% decreased from 7.9% 6 months ago. She said that her blood sugar has been dropping so she has had to eat more to compensate. She has an appointment with her endocrinologist on November 21st.    Past Medical History:  Diagnosis Date   Allergy    Arthritis    Colon polyp 10/16/2005   inflamed, Dr. Jerrell Sol   DM (diabetes mellitus) [facility]    Hx of adenomatous polyp of colon 02/06/2018   Hyperlipemia    Hypertension      Family History  Problem Relation Age of Onset   Diabetes Mother    Diabetes Father    Heart disease Father    Prostate cancer Father    Diabetes Sister        x2   Diabetes Maternal Grandmother    Diabetes Paternal Grandmother    Colon cancer Neg Hx    Breast cancer Neg Hx    Esophageal cancer Neg Hx    Rectal cancer Neg Hx    Stomach cancer Neg Hx    Colon polyps Neg Hx     Social History   Social History Narrative   Retired from engineer, 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 2 sons and 1 daughter. Non-smoker or drinker.       Review of Systems  All other systems reviewed and are negative.       Objective:   Vitals: BP 120/80   Pulse 80   Ht 5' 3.5 (1.613 m)   Wt 176 lb (79.8 kg)   SpO2 98%   BMI 30.69 kg/m    Physical Exam Vitals and nursing note reviewed.  Constitutional:      General: She is not in acute distress.    Appearance: Normal appearance. She is not toxic-appearing.  HENT:     Head: Normocephalic and atraumatic.  Cardiovascular:     Rate and Rhythm: Normal rate and regular rhythm. No extrasystoles are present.    Pulses: Normal pulses.     Heart sounds: Normal heart sounds. No murmur heard.    No friction rub. No gallop.  Pulmonary:     Effort: Pulmonary effort is normal. No respiratory distress.     Breath sounds: Normal breath sounds. No wheezing or rales.  Skin:    General: Skin is warm and dry.  Neurological:     Mental Status: She is alert and oriented to person, place, and time. Mental status is at baseline.  Psychiatric:  Mood and Affect: Mood normal.        Behavior: Behavior normal.        Thought Content: Thought content normal.        Judgment: Judgment normal.       Results:     Labs:       Component Value Date/Time   NA 137 07/12/2023 1205   K 4.3 07/12/2023 1205   CL 99 07/12/2023 1205   CO2 27 07/12/2023 1205   GLUCOSE 146 (H) 07/12/2023 1205   BUN 23 07/12/2023 1205   CREATININE 0.80 07/12/2023 1205   CALCIUM 10.3 07/12/2023 1205   PROT 7.4 01/18/2024 0943   ALBUMIN 4.0 05/29/2016 0900   AST 29 01/18/2024 0943   ALT 34 (H) 01/18/2024 0943   ALKPHOS 52 05/29/2016 0900   BILITOT 0.4 01/18/2024 0943   GFRNONAA 86 06/14/2020 0930   GFRAA 99 06/14/2020 0930     Lab Results  Component Value Date   WBC 7.5 07/12/2023   HGB 14.2 07/12/2023   HCT 43.5 07/12/2023   MCV 88.4 07/12/2023   PLT 267 07/12/2023    Lab Results  Component Value Date   CHOL  141 01/18/2024   HDL 44 (L) 01/18/2024   LDLCALC 76 01/18/2024   TRIG 119 01/18/2024   CHOLHDL 3.2 01/18/2024    Lab Results  Component Value Date   HGBA1C 7.6 (H) 01/18/2024     Lab Results  Component Value Date   TSH 1.41 07/12/2023        Assessment & Plan:   Hypertension: treated with Atenolol  50 mg daily, Carvedilol  12.5 mg twice daily, Diltiazem  300 mg daily, HCTZ 12.5 mg twice daily, and Olmesartan  40 mg daily.  Blood pressure today is normal at 120/80. Continue current meds.  Hyperlipidemia: treated with Simvastatin  20 mg daily. 01/18/2024 Lipid Panel HDL 44, otherwise Triglycerides, LDL  and total cholesterol are normal. Continue statin medication.  Diabetes Mellitus, Type II treated with Novolog  12 units three times daily, Metformin  500 mg twice daily, and lantus  50 units daily. 01/18/2024 HgbA1c 7.6% decreased from 7.9% when checked 6 months ago. She said that her blood sugar has been dropping so she has had to eat more to compensate. She has an appointment with her Endocrinologist, Dr. Braulio, on November 21st.    I,Makayla C Reid,acting as a scribe for Ronal JINNY Hailstone, MD.,have documented all relevant documentation on the behalf of Ronal JINNY Hailstone, MD,as directed by  Ronal JINNY Hailstone, MD while in the presence of Ronal JINNY Hailstone, MD.  I, Ronal JINNY Hailstone, MD, have reviewed all documentation for this visit. The documentation on 01/20/2024 for the exam, diagnosis, procedures, and orders are all accurate and complete.

## 2024-01-20 ENCOUNTER — Ambulatory Visit: Payer: Self-pay | Admitting: Internal Medicine

## 2024-01-20 ENCOUNTER — Encounter: Payer: Self-pay | Admitting: Internal Medicine

## 2024-01-20 ENCOUNTER — Other Ambulatory Visit: Payer: Self-pay

## 2024-01-20 ENCOUNTER — Other Ambulatory Visit (HOSPITAL_COMMUNITY): Payer: Self-pay

## 2024-01-20 VITALS — BP 120/80 | HR 80 | Ht 63.5 in | Wt 176.0 lb

## 2024-01-20 DIAGNOSIS — E1169 Type 2 diabetes mellitus with other specified complication: Secondary | ICD-10-CM

## 2024-01-20 DIAGNOSIS — I10 Essential (primary) hypertension: Secondary | ICD-10-CM

## 2024-01-20 DIAGNOSIS — Z6830 Body mass index (BMI) 30.0-30.9, adult: Secondary | ICD-10-CM

## 2024-01-20 DIAGNOSIS — Z794 Long term (current) use of insulin: Secondary | ICD-10-CM | POA: Diagnosis not present

## 2024-01-20 DIAGNOSIS — E785 Hyperlipidemia, unspecified: Secondary | ICD-10-CM | POA: Diagnosis not present

## 2024-01-20 DIAGNOSIS — E119 Type 2 diabetes mellitus without complications: Secondary | ICD-10-CM

## 2024-01-20 MED ORDER — AMOXICILLIN 500 MG PO CAPS
2000.0000 mg | ORAL_CAPSULE | Freq: Once | ORAL | 0 refills | Status: AC
  Filled 2024-01-20: qty 12, 3d supply, fill #0

## 2024-01-24 ENCOUNTER — Encounter: Payer: Self-pay | Admitting: Internal Medicine

## 2024-01-24 NOTE — Patient Instructions (Signed)
 We are sorry to hear about your sister-in -law's passing and offer condolences. Your labs are stable. Continue current medications and see Dr. Braulio for follow up later this month. Wellness visit is due in 6 months.

## 2024-02-04 DIAGNOSIS — E1165 Type 2 diabetes mellitus with hyperglycemia: Secondary | ICD-10-CM | POA: Diagnosis not present

## 2024-02-04 DIAGNOSIS — E7800 Pure hypercholesterolemia, unspecified: Secondary | ICD-10-CM | POA: Diagnosis not present

## 2024-02-04 DIAGNOSIS — E559 Vitamin D deficiency, unspecified: Secondary | ICD-10-CM | POA: Diagnosis not present

## 2024-02-04 DIAGNOSIS — E11649 Type 2 diabetes mellitus with hypoglycemia without coma: Secondary | ICD-10-CM | POA: Diagnosis not present

## 2024-02-23 ENCOUNTER — Other Ambulatory Visit: Payer: Self-pay

## 2024-02-25 ENCOUNTER — Other Ambulatory Visit: Payer: Self-pay

## 2024-02-25 ENCOUNTER — Other Ambulatory Visit (HOSPITAL_COMMUNITY): Payer: Self-pay

## 2024-02-25 ENCOUNTER — Other Ambulatory Visit: Payer: Self-pay | Admitting: Internal Medicine

## 2024-02-25 DIAGNOSIS — E119 Type 2 diabetes mellitus without complications: Secondary | ICD-10-CM

## 2024-02-25 MED ORDER — ACCU-CHEK SAFE-T PRO LANCETS MISC
99 refills | Status: AC
  Filled 2024-02-25: qty 300, 75d supply, fill #0

## 2024-03-20 ENCOUNTER — Other Ambulatory Visit: Payer: Self-pay

## 2024-03-20 ENCOUNTER — Other Ambulatory Visit (HOSPITAL_COMMUNITY): Payer: Self-pay

## 2024-03-20 MED ORDER — CARVEDILOL 12.5 MG PO TABS
12.5000 mg | ORAL_TABLET | Freq: Two times a day (BID) | ORAL | 5 refills | Status: AC
  Filled 2024-03-20: qty 60, 30d supply, fill #0
  Filled 2024-04-19: qty 180, 90d supply, fill #1

## 2024-03-31 ENCOUNTER — Ambulatory Visit: Admitting: Internal Medicine

## 2024-03-31 ENCOUNTER — Encounter: Payer: Self-pay | Admitting: Internal Medicine

## 2024-03-31 ENCOUNTER — Other Ambulatory Visit (HOSPITAL_COMMUNITY): Payer: Self-pay

## 2024-03-31 VITALS — BP 130/88 | HR 92 | Temp 99.6°F | Ht 63.5 in | Wt 175.0 lb

## 2024-03-31 DIAGNOSIS — H1032 Unspecified acute conjunctivitis, left eye: Secondary | ICD-10-CM

## 2024-03-31 DIAGNOSIS — I10 Essential (primary) hypertension: Secondary | ICD-10-CM

## 2024-03-31 DIAGNOSIS — E119 Type 2 diabetes mellitus without complications: Secondary | ICD-10-CM

## 2024-03-31 DIAGNOSIS — H109 Unspecified conjunctivitis: Secondary | ICD-10-CM

## 2024-03-31 MED ORDER — OFLOXACIN 0.3 % OP SOLN
OPHTHALMIC | 0 refills | Status: AC
  Filled 2024-03-31: qty 5, 5d supply, fill #0

## 2024-03-31 MED ORDER — OFLOXACIN 0.3 % OP SOLN
OPHTHALMIC | 0 refills | Status: DC
  Filled 2024-03-31: qty 5, fill #0

## 2024-03-31 NOTE — Progress Notes (Signed)
 "   Patient Care Team: Perri Ronal PARAS, MD as PCP - General (Internal Medicine)  Visit Date: 03/31/24  Subjective:    Patient ID: Lisa Robertson , Female   DOB: [birth year], 69 y.o.    MRN: [id number]   68 y.o. Female presents today for Facial swelling. Patient has a past medical history of Hypertension, Hyperlipidemia, Diabetes Mellitus.  Her left eye is  red and slightly swollen. She said that she recently had an upper respiratory infection. She denies drainage from left eye but says that she her eye is irritated. She doesn't use any make up and denies changing any products that she uses around her face. Her left conjunctiva is erythematous.    Past Medical History:  Diagnosis Date   Allergy    Arthritis    Colon polyp 10/16/2005   inflamed, Dr. Jerrell Sol   DM (diabetes mellitus) [facility])    Hx of adenomatous polyp of colon 02/06/2018   Hyperlipemia    Hypertension      Family History  Problem Relation Age of Onset   Diabetes Mother    Diabetes Father    Heart disease Father    Prostate cancer Father    Diabetes Sister        x2   Diabetes Maternal Grandmother    Diabetes Paternal Grandmother    Colon cancer Neg Hx    Breast cancer Neg Hx    Esophageal cancer Neg Hx    Rectal cancer Neg Hx    Stomach cancer Neg Hx    Colon polyps Neg Hx     Social History   Social History Narrative   Retired from engineer, manufacturing. Married - husband is retired as an educational psychologist at [employer]. 2 sons and 1 daughter. Non-smoker or drinker.       ROS: no fever, chills, headache, nausea, stabbing eye pain      Objective:   Vitals: BP 130/88   Pulse 92   Temp 99.6 F (37.6 C)   Ht 5' 3.5 (1.613 m)   Wt 175 lb (79.4 kg)   SpO2 98%   BMI 30.51 kg/m    Physical Exam Eyes:     Extraocular Movements: Extraocular movements intact.     Conjunctiva/sclera:     Left eye: Left conjunctiva is injected.     Pupils: Pupils are equal, round, and reactive  to light.     Comments: Conjunctiva erythematous.        Results:   Studies obtained and personally reviewed by me:   Labs:       Component Value Date/Time   NA 137 07/12/2023 1205   K 4.3 07/12/2023 1205   CL 99 07/12/2023 1205   CO2 27 07/12/2023 1205   GLUCOSE 146 (H) 07/12/2023 1205   BUN 23 07/12/2023 1205   CREATININE 0.80 07/12/2023 1205   CALCIUM 10.3 07/12/2023 1205   PROT 7.4 01/18/2024 0943   ALBUMIN 4.0 05/29/2016 0900   AST 29 01/18/2024 0943   ALT 34 (H) 01/18/2024 0943   ALKPHOS 52 05/29/2016 0900   BILITOT 0.4 01/18/2024 0943   GFRNONAA 86 06/14/2020 0930   GFRAA 99 06/14/2020 0930     Lab Results  Component Value Date   WBC 7.5 07/12/2023   HGB 14.2 07/12/2023   HCT 43.5 07/12/2023   MCV 88.4 07/12/2023   PLT 267 07/12/2023    Lab Results  Component Value Date   CHOL 141 01/18/2024   HDL 44 (L)  01/18/2024   LDLCALC 76 01/18/2024   TRIG 119 01/18/2024   CHOLHDL 3.2 01/18/2024    Lab Results  Component Value Date   HGBA1C 7.6 (H) 01/18/2024     Lab Results  Component Value Date   TSH 1.41 07/12/2023        Assessment & Plan:   Meds ordered this encounter  Medications   ofloxacin  (OCUFLOX ) 0.3 % ophthalmic solution    Sig: 2 drops left eye 4 times a day for 5 days    Dispense:  5 mL    Refill:  0   Acute Bacterial Conjunctivitis of left eye: Her left eye is a red a slightly swollen. She said that she recently had a cold. She denies drainage but says that she her eye is irritable. She doesn't use any make up and denies changing any products that she uses around her face. Her left conjunctiva is erythematous.     Ocuflox   0.3% ophthalmic solution 2 drops 4 times a day in left eye for 5 days prescribed.   May apply warm compresses to eye 20 minutes twice daily for 3-5 days. Call if not better in 48 hours or sooner if worse.  I,Makayla C Reid,acting as a scribe for Ronal JINNY Hailstone, MD.,have documented all relevant documentation on  the behalf of Ronal JINNY Hailstone, MD,as directed by  Ronal JINNY Hailstone, MD while in the presence of Ronal JINNY Hailstone, MD.      "

## 2024-04-03 ENCOUNTER — Other Ambulatory Visit: Payer: Self-pay

## 2024-04-10 ENCOUNTER — Encounter: Payer: Self-pay | Admitting: Internal Medicine

## 2024-04-10 NOTE — Patient Instructions (Signed)
 Instill Ocuflox  ophthalmic drops into left eye 4 times daily for 5 days. May use warm compresses topically on upper eye lid.Call if not better in 48 hours or sooner if worse.

## 2024-04-19 ENCOUNTER — Other Ambulatory Visit: Payer: Self-pay | Admitting: Internal Medicine

## 2024-04-19 ENCOUNTER — Other Ambulatory Visit: Payer: Self-pay

## 2024-04-20 ENCOUNTER — Other Ambulatory Visit (HOSPITAL_COMMUNITY): Payer: Self-pay

## 2024-04-20 MED ORDER — DILTIAZEM HCL ER COATED BEADS 300 MG PO CP24
300.0000 mg | ORAL_CAPSULE | Freq: Every day | ORAL | 3 refills | Status: AC
  Filled 2024-04-20: qty 90, 90d supply, fill #0

## 2024-04-20 MED ORDER — SIMVASTATIN 20 MG PO TABS
20.0000 mg | ORAL_TABLET | Freq: Every evening | ORAL | 3 refills | Status: AC
  Filled 2024-04-20: qty 90, 90d supply, fill #0

## 2024-04-20 MED ORDER — HYDROCHLOROTHIAZIDE 12.5 MG PO CAPS
12.5000 mg | ORAL_CAPSULE | Freq: Two times a day (BID) | ORAL | 3 refills | Status: AC
  Filled 2024-04-20: qty 180, 90d supply, fill #0

## 2024-04-20 MED ORDER — METFORMIN HCL 500 MG PO TABS
500.0000 mg | ORAL_TABLET | Freq: Two times a day (BID) | ORAL | 3 refills | Status: AC
  Filled 2024-04-20: qty 180, 90d supply, fill #0

## 2024-07-24 ENCOUNTER — Other Ambulatory Visit

## 2024-07-31 ENCOUNTER — Ambulatory Visit: Admitting: Internal Medicine
